# Patient Record
Sex: Male | Born: 1990 | Hispanic: Yes | Marital: Single | Smoking: Never smoker
Health system: Southern US, Community
[De-identification: ages and names within clinical notes are randomized; demographics above are authoritative.]

## PROBLEM LIST (undated history)

## (undated) ENCOUNTER — Emergency Department: Admission: EM | Discharge status: Against Medical Advice | Payer: Self-pay

## (undated) DIAGNOSIS — F329 Major depressive disorder, single episode, unspecified: Secondary | ICD-10-CM

## (undated) DIAGNOSIS — F32A Depression, unspecified: Secondary | ICD-10-CM

## (undated) HISTORY — PX: EYE MUSCLE SURGERY: SHX370

---
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
## 2013-06-26 ENCOUNTER — Encounter (HOSPITAL_COMMUNITY): Payer: Medicaid - Out of State | Admitting: Anesthesiology

## 2013-06-26 ENCOUNTER — Inpatient Hospital Stay (HOSPITAL_COMMUNITY): Payer: Medicaid - Out of State

## 2013-06-26 ENCOUNTER — Encounter (HOSPITAL_COMMUNITY): Admission: EM | Disposition: A | Discharge status: Home / Self Care | Payer: Self-pay | Source: Home / Self Care

## 2013-06-26 ENCOUNTER — Inpatient Hospital Stay (HOSPITAL_COMMUNITY)
Admission: EM | Admit: 2013-06-26 | Discharge: 2013-07-01 | DRG: 481 | Disposition: A | Discharge status: Home / Self Care | Payer: Medicaid - Out of State | Attending: General Surgery | Admitting: General Surgery

## 2013-06-26 ENCOUNTER — Emergency Department (HOSPITAL_COMMUNITY): Payer: Medicaid - Out of State

## 2013-06-26 ENCOUNTER — Emergency Department (HOSPITAL_COMMUNITY): Payer: Medicaid - Out of State | Admitting: Anesthesiology

## 2013-06-26 ENCOUNTER — Encounter (HOSPITAL_COMMUNITY): Payer: Self-pay | Admitting: Emergency Medicine

## 2013-06-26 DIAGNOSIS — S7290XA Unspecified fracture of unspecified femur, initial encounter for closed fracture: Principal | ICD-10-CM | POA: Diagnosis present

## 2013-06-26 DIAGNOSIS — F411 Generalized anxiety disorder: Secondary | ICD-10-CM | POA: Diagnosis not present

## 2013-06-26 DIAGNOSIS — S71132A Puncture wound without foreign body, left thigh, initial encounter: Secondary | ICD-10-CM

## 2013-06-26 DIAGNOSIS — S71109A Unspecified open wound, unspecified thigh, initial encounter: Secondary | ICD-10-CM | POA: Diagnosis present

## 2013-06-26 DIAGNOSIS — W3400XA Accidental discharge from unspecified firearms or gun, initial encounter: Secondary | ICD-10-CM | POA: Diagnosis not present

## 2013-06-26 DIAGNOSIS — D62 Acute posthemorrhagic anemia: Secondary | ICD-10-CM

## 2013-06-26 DIAGNOSIS — S7292XA Unspecified fracture of left femur, initial encounter for closed fracture: Secondary | ICD-10-CM

## 2013-06-26 DIAGNOSIS — S71009A Unspecified open wound, unspecified hip, initial encounter: Secondary | ICD-10-CM | POA: Diagnosis present

## 2013-06-26 HISTORY — DX: Depression, unspecified: F32.A

## 2013-06-26 HISTORY — PX: I&D EXTREMITY: SHX5045

## 2013-06-26 HISTORY — DX: Major depressive disorder, single episode, unspecified: F32.9

## 2013-06-26 HISTORY — PX: FEMUR IM NAIL: SHX1597

## 2013-06-26 LAB — BASIC METABOLIC PANEL
BUN: 8 mg/dL (ref 6–23)
CO2: 24 mEq/L (ref 19–32)
CREATININE: 0.74 mg/dL (ref 0.50–1.35)
Calcium: 7.9 mg/dL — ABNORMAL LOW (ref 8.4–10.5)
Chloride: 107 mEq/L (ref 96–112)
GFR calc Af Amer: >90 mL/min (ref >90)
GLUCOSE: 149 mg/dL — AB (ref 70–99)
POTASSIUM: 4.1 meq/L (ref 3.7–5.3)
Sodium: 142 mEq/L (ref 137–147)

## 2013-06-26 LAB — CBC
HEMATOCRIT: 37.6 % — AB (ref 39.0–52.0)
HEMATOCRIT: 32.6 % — AB (ref 39.0–52.0)
Hemoglobin: 12.6 g/dL — ABNORMAL LOW (ref 13.0–17.0)
Hemoglobin: 11.1 g/dL — ABNORMAL LOW (ref 13.0–17.0)
MCH: 30.3 pg (ref 26.0–34.0)
MCH: 30.7 pg (ref 26.0–34.0)
MCHC: 33.5 g/dL (ref 30.0–36.0)
MCHC: 34 g/dL (ref 30.0–36.0)
MCV: 90.4 fL (ref 78.0–100.0)
MCV: 90.1 fL (ref 78.0–100.0)
Platelets: 158 10*3/uL (ref 150–400)
Platelets: 149 10*3/uL — ABNORMAL LOW (ref 150–400)
RBC: 4.16 MIL/uL — ABNORMAL LOW (ref 4.22–5.81)
RBC: 3.62 MIL/uL — ABNORMAL LOW (ref 4.22–5.81)
RDW: 12.8 % (ref 11.5–15.5)
RDW: 12.6 % (ref 11.5–15.5)
WBC: 17.4 10*3/uL — ABNORMAL HIGH (ref 4.0–10.5)
WBC: 11.5 10*3/uL — ABNORMAL HIGH (ref 4.0–10.5)

## 2013-06-26 LAB — POCT I-STAT 4, (NA,K, GLUC, HGB,HCT)
Glucose, Bld: 108 mg/dL — ABNORMAL HIGH (ref 70–99)
Glucose, Bld: 112 mg/dL — ABNORMAL HIGH (ref 70–99)
HCT: 42 % (ref 39.0–52.0)
HCT: 36 % — ABNORMAL LOW (ref 39.0–52.0)
HEMOGLOBIN: 14.3 g/dL (ref 13.0–17.0)
HEMOGLOBIN: 12.2 g/dL — AB (ref 13.0–17.0)
POTASSIUM: 3.7 meq/L (ref 3.7–5.3)
Potassium: 3.8 mEq/L (ref 3.7–5.3)
SODIUM: 142 meq/L (ref 137–147)
Sodium: 141 mEq/L (ref 137–147)

## 2013-06-26 LAB — CBC WITH DIFFERENTIAL/PLATELET
BASOS PCT: 0 % (ref 0–1)
Basophils Absolute: 0 10*3/uL (ref 0.0–0.1)
EOS ABS: 0.2 10*3/uL (ref 0.0–0.7)
EOS PCT: 1 % (ref 0–5)
HCT: 46.4 % (ref 39.0–52.0)
Hemoglobin: 15.8 g/dL (ref 13.0–17.0)
LYMPHS ABS: 5.8 10*3/uL — AB (ref 0.7–4.0)
Lymphocytes Relative: 48 % — ABNORMAL HIGH (ref 12–46)
MCH: 30.8 pg (ref 26.0–34.0)
MCHC: 34.1 g/dL (ref 30.0–36.0)
MCV: 90.4 fL (ref 78.0–100.0)
Monocytes Absolute: 0.9 10*3/uL (ref 0.1–1.0)
Monocytes Relative: 7 % (ref 3–12)
NEUTROS PCT: 44 % (ref 43–77)
Neutro Abs: 5.4 10*3/uL (ref 1.7–7.7)
Platelets: 183 10*3/uL (ref 150–400)
RBC: 5.13 MIL/uL (ref 4.22–5.81)
RDW: 12.6 % (ref 11.5–15.5)
WBC: 12.2 10*3/uL — ABNORMAL HIGH (ref 4.0–10.5)

## 2013-06-26 LAB — COMPREHENSIVE METABOLIC PANEL
ALBUMIN: 4.2 g/dL (ref 3.5–5.2)
ALT: 10 U/L (ref 0–53)
AST: 18 U/L (ref 0–37)
Alkaline Phosphatase: 56 U/L (ref 39–117)
BUN: 9 mg/dL (ref 6–23)
CALCIUM: 8.8 mg/dL (ref 8.4–10.5)
CO2: 21 mEq/L (ref 19–32)
Chloride: 101 mEq/L (ref 96–112)
Creatinine, Ser: 0.92 mg/dL (ref 0.50–1.35)
GFR calc Af Amer: >90 mL/min (ref >90)
GFR calc non Af Amer: >90 mL/min (ref >90)
GLUCOSE: 121 mg/dL — AB (ref 70–99)
Potassium: 3.4 mEq/L — ABNORMAL LOW (ref 3.7–5.3)
SODIUM: 141 meq/L (ref 137–147)
TOTAL PROTEIN: 6.9 g/dL (ref 6.0–8.3)
Total Bilirubin: 0.4 mg/dL (ref 0.3–1.2)

## 2013-06-26 LAB — CREATININE, SERUM: CREATININE: 0.74 mg/dL (ref 0.50–1.35)

## 2013-06-26 LAB — PREPARE FRESH FROZEN PLASMA
UNIT DIVISION: 0
Unit division: 0

## 2013-06-26 LAB — ABO/RH: ABO/RH(D): O POS

## 2013-06-26 SURGERY — INSERTION, INTRAMEDULLARY ROD, FEMUR
Anesthesia: General | Site: Leg Upper | Laterality: Left

## 2013-06-26 MED ORDER — METOCLOPRAMIDE HCL 10 MG PO TABS
5.0000 mg | ORAL_TABLET | Freq: Three times a day (TID) | ORAL | Status: DC | PRN
  Administered 2013-06-26: 10 mg via ORAL
  Filled 2013-06-26: qty 1

## 2013-06-26 MED ORDER — HYDROMORPHONE HCL PF 1 MG/ML IJ SOLN
INTRAMUSCULAR | Status: AC
  Filled 2013-06-26: qty 2

## 2013-06-26 MED ORDER — ONDANSETRON HCL 4 MG/2ML IJ SOLN: 4.0000 mg | Freq: Four times a day (QID) | INTRAMUSCULAR | Status: DC | PRN

## 2013-06-26 MED ORDER — LACTATED RINGERS IV SOLN
INTRAVENOUS | Status: DC | PRN
  Administered 2013-06-26 (×2): via INTRAVENOUS

## 2013-06-26 MED ORDER — HYDROMORPHONE HCL PF 1 MG/ML IJ SOLN
1.0000 mg | INTRAMUSCULAR | Status: DC | PRN
  Administered 2013-06-26: 1 mg via INTRAVENOUS
  Filled 2013-06-26: qty 1

## 2013-06-26 MED ORDER — ONDANSETRON HCL 4 MG/2ML IJ SOLN
4.0000 mg | Freq: Four times a day (QID) | INTRAMUSCULAR | Status: DC | PRN
  Administered 2013-06-26: 4 mg via INTRAVENOUS
  Filled 2013-06-26: qty 2

## 2013-06-26 MED ORDER — ONDANSETRON HCL 4 MG PO TABS: 4.0000 mg | ORAL_TABLET | Freq: Four times a day (QID) | ORAL | Status: DC | PRN

## 2013-06-26 MED ORDER — SUCCINYLCHOLINE CHLORIDE 20 MG/ML IJ SOLN
INTRAMUSCULAR | Status: DC | PRN
  Administered 2013-06-26: 100 mg via INTRAVENOUS

## 2013-06-26 MED ORDER — LIDOCAINE HCL (CARDIAC) 20 MG/ML IV SOLN
INTRAVENOUS | Status: AC
  Filled 2013-06-26: qty 5

## 2013-06-26 MED ORDER — HYDROMORPHONE HCL PF 1 MG/ML IJ SOLN
0.2500 mg | INTRAMUSCULAR | Status: DC | PRN
  Administered 2013-06-26 (×3): 0.5 mg via INTRAVENOUS

## 2013-06-26 MED ORDER — MENTHOL 3 MG MT LOZG: 1.0000 | LOZENGE | OROMUCOSAL | Status: DC | PRN

## 2013-06-26 MED ORDER — ENOXAPARIN SODIUM 40 MG/0.4ML ~~LOC~~ SOLN
40.0000 mg | SUBCUTANEOUS | Status: DC
  Administered 2013-06-27 – 2013-06-28 (×2): 40 mg via SUBCUTANEOUS
  Filled 2013-06-26 (×3): qty 0.4

## 2013-06-26 MED ORDER — LIDOCAINE HCL (CARDIAC) 20 MG/ML IV SOLN
INTRAVENOUS | Status: DC | PRN
  Administered 2013-06-26: 50 mg via INTRAVENOUS

## 2013-06-26 MED ORDER — FENTANYL CITRATE 0.05 MG/ML IJ SOLN
INTRAMUSCULAR | Status: DC | PRN
  Administered 2013-06-26: 50 ug via INTRAVENOUS
  Administered 2013-06-26: 25 ug via INTRAVENOUS
  Administered 2013-06-26: 50 ug via INTRAVENOUS
  Administered 2013-06-26: 25 ug via INTRAVENOUS

## 2013-06-26 MED ORDER — METHOCARBAMOL 1000 MG/10ML IJ SOLN: 500.0000 mg | Freq: Four times a day (QID) | INTRAVENOUS | Status: DC | PRN

## 2013-06-26 MED ORDER — SODIUM CHLORIDE 0.9 % IV BOLUS (SEPSIS)
1000.0000 mL | Freq: Once | INTRAVENOUS | Status: AC
  Administered 2013-06-26: 1000 mL via INTRAVENOUS

## 2013-06-26 MED ORDER — CEFAZOLIN SODIUM-DEXTROSE 2-3 GM-% IV SOLR
INTRAVENOUS | Status: AC
  Filled 2013-06-26: qty 50

## 2013-06-26 MED ORDER — OXYCODONE HCL 5 MG PO TABS
ORAL_TABLET | ORAL | Status: AC
  Filled 2013-06-26: qty 1

## 2013-06-26 MED ORDER — PHENYLEPHRINE HCL 10 MG/ML IJ SOLN
INTRAMUSCULAR | Status: DC | PRN
  Administered 2013-06-26 (×2): 80 ug via INTRAVENOUS
  Administered 2013-06-26 (×3): 40 ug via INTRAVENOUS

## 2013-06-26 MED ORDER — FENTANYL CITRATE 0.05 MG/ML IJ SOLN
INTRAMUSCULAR | Status: AC
  Filled 2013-06-26: qty 5

## 2013-06-26 MED ORDER — PHENYLEPHRINE 40 MCG/ML (10ML) SYRINGE FOR IV PUSH (FOR BLOOD PRESSURE SUPPORT)
PREFILLED_SYRINGE | INTRAVENOUS | Status: AC
  Filled 2013-06-26: qty 10

## 2013-06-26 MED ORDER — MIDAZOLAM HCL 2 MG/2ML IJ SOLN
INTRAMUSCULAR | Status: AC
  Filled 2013-06-26: qty 2

## 2013-06-26 MED ORDER — HYDROMORPHONE HCL PF 1 MG/ML IJ SOLN: 1.0000 mg | INTRAMUSCULAR | Status: DC | PRN

## 2013-06-26 MED ORDER — KCL IN DEXTROSE-NACL 20-5-0.45 MEQ/L-%-% IV SOLN
INTRAVENOUS | Status: AC
  Administered 2013-06-26: 1000 mL
  Filled 2013-06-26: qty 1000

## 2013-06-26 MED ORDER — DEXTROSE-NACL 5-0.9 % IV SOLN: INTRAVENOUS | Status: DC

## 2013-06-26 MED ORDER — OXYCODONE HCL 5 MG PO TABS
10.0000 mg | ORAL_TABLET | ORAL | Status: DC | PRN
  Administered 2013-06-26 (×4): 10 mg via ORAL
  Administered 2013-06-27 – 2013-07-01 (×11): 20 mg via ORAL
  Administered 2013-07-01: 10 mg via ORAL
  Administered 2013-07-01: 20 mg via ORAL
  Filled 2013-06-26 (×2): qty 4
  Filled 2013-06-26: qty 2
  Filled 2013-06-26 (×7): qty 4
  Filled 2013-06-26: qty 2
  Filled 2013-06-26: qty 4
  Filled 2013-06-26 (×2): qty 2
  Filled 2013-06-26 (×2): qty 4
  Filled 2013-06-26: qty 2

## 2013-06-26 MED ORDER — MIDAZOLAM HCL 5 MG/5ML IJ SOLN
INTRAMUSCULAR | Status: DC | PRN
  Administered 2013-06-26: 2 mg via INTRAVENOUS

## 2013-06-26 MED ORDER — HYDROMORPHONE HCL PF 1 MG/ML IJ SOLN
2.0000 mg | Freq: Once | INTRAMUSCULAR | Status: AC
  Administered 2013-06-26: 2 mg via INTRAVENOUS

## 2013-06-26 MED ORDER — OXYCODONE HCL 5 MG PO TABS
5.0000 mg | ORAL_TABLET | ORAL | Status: DC | PRN
  Administered 2013-06-26: 10 mg via ORAL

## 2013-06-26 MED ORDER — HYDROMORPHONE HCL PF 1 MG/ML IJ SOLN
0.5000 mg | INTRAMUSCULAR | Status: DC | PRN
  Administered 2013-06-26: 0.5 mg via INTRAVENOUS
  Filled 2013-06-26 (×2): qty 1

## 2013-06-26 MED ORDER — ACETAMINOPHEN 650 MG RE SUPP: 650.0000 mg | Freq: Four times a day (QID) | RECTAL | Status: DC | PRN

## 2013-06-26 MED ORDER — LACTATED RINGERS IV SOLN
INTRAVENOUS | Status: DC | PRN
  Administered 2013-06-26: 01:00:00 via INTRAVENOUS

## 2013-06-26 MED ORDER — ACETAMINOPHEN 325 MG PO TABS
650.0000 mg | ORAL_TABLET | Freq: Four times a day (QID) | ORAL | Status: DC | PRN
  Administered 2013-06-28: 650 mg via ORAL
  Filled 2013-06-26: qty 2

## 2013-06-26 MED ORDER — KCL IN DEXTROSE-NACL 20-5-0.45 MEQ/L-%-% IV SOLN
INTRAVENOUS | Status: DC
  Filled 2013-06-26 (×2): qty 1000

## 2013-06-26 MED ORDER — SODIUM CHLORIDE 0.9 % IR SOLN
Status: DC | PRN
  Administered 2013-06-26: 3000 mL

## 2013-06-26 MED ORDER — ONDANSETRON HCL 4 MG/2ML IJ SOLN
INTRAMUSCULAR | Status: AC
  Filled 2013-06-26: qty 2

## 2013-06-26 MED ORDER — HYDROCODONE-ACETAMINOPHEN 5-325 MG PO TABS: 2.0000 | ORAL_TABLET | ORAL | Status: DC | PRN

## 2013-06-26 MED ORDER — METOCLOPRAMIDE HCL 5 MG/ML IJ SOLN
5.0000 mg | Freq: Three times a day (TID) | INTRAMUSCULAR | Status: DC | PRN
  Administered 2013-06-26: 10 mg via INTRAVENOUS
  Filled 2013-06-26 (×2): qty 2

## 2013-06-26 MED ORDER — HYDROCODONE-ACETAMINOPHEN 5-325 MG PO TABS: 1.0000 | ORAL_TABLET | Freq: Four times a day (QID) | ORAL | Status: DC | PRN

## 2013-06-26 MED ORDER — CEFAZOLIN SODIUM-DEXTROSE 2-3 GM-% IV SOLR
2.0000 g | Freq: Once | INTRAVENOUS | Status: AC
  Administered 2013-06-26: 2 g via INTRAVENOUS

## 2013-06-26 MED ORDER — HYDROMORPHONE HCL PF 1 MG/ML IJ SOLN
1.0000 mg | INTRAMUSCULAR | Status: DC | PRN
  Administered 2013-06-26 – 2013-06-28 (×5): 1 mg via INTRAVENOUS
  Filled 2013-06-26 (×4): qty 1

## 2013-06-26 MED ORDER — METHOCARBAMOL 500 MG PO TABS
500.0000 mg | ORAL_TABLET | Freq: Four times a day (QID) | ORAL | Status: DC | PRN
  Administered 2013-06-26 – 2013-07-01 (×9): 500 mg via ORAL
  Filled 2013-06-26 (×9): qty 1

## 2013-06-26 MED ORDER — SUCCINYLCHOLINE CHLORIDE 20 MG/ML IJ SOLN
INTRAMUSCULAR | Status: AC
  Filled 2013-06-26: qty 1

## 2013-06-26 MED ORDER — OXYCODONE HCL 5 MG PO TABS
ORAL_TABLET | ORAL | Status: AC
  Administered 2013-06-27: 20 mg via ORAL
  Filled 2013-06-26: qty 2

## 2013-06-26 MED ORDER — ONDANSETRON HCL 4 MG/2ML IJ SOLN
INTRAMUSCULAR | Status: DC | PRN
  Administered 2013-06-26: 4 mg via INTRAVENOUS

## 2013-06-26 MED ORDER — ALBUMIN HUMAN 5 % IV SOLN
INTRAVENOUS | Status: DC | PRN
  Administered 2013-06-26 (×2): via INTRAVENOUS

## 2013-06-26 MED ORDER — HYDROMORPHONE HCL PF 1 MG/ML IJ SOLN
INTRAMUSCULAR | Status: AC
  Administered 2013-06-26: 1 mg via INTRAVENOUS
  Filled 2013-06-26: qty 1

## 2013-06-26 MED ORDER — PHENOL 1.4 % MT LIQD: 1.0000 | OROMUCOSAL | Status: DC | PRN

## 2013-06-26 MED ORDER — CEFAZOLIN SODIUM-DEXTROSE 2-3 GM-% IV SOLR
2.0000 g | Freq: Three times a day (TID) | INTRAVENOUS | Status: AC
  Administered 2013-06-26 – 2013-06-27 (×6): 2 g via INTRAVENOUS
  Filled 2013-06-26 (×6): qty 50

## 2013-06-26 MED ORDER — ARTIFICIAL TEARS OP OINT
TOPICAL_OINTMENT | OPHTHALMIC | Status: AC
  Filled 2013-06-26: qty 3.5

## 2013-06-26 MED ORDER — ONDANSETRON HCL 4 MG/2ML IJ SOLN
4.0000 mg | Freq: Once | INTRAMUSCULAR | Status: AC
  Administered 2013-06-26: 4 mg via INTRAVENOUS

## 2013-06-26 MED ORDER — HYDROMORPHONE HCL PF 1 MG/ML IJ SOLN
INTRAMUSCULAR | Status: AC
  Filled 2013-06-26: qty 1

## 2013-06-26 MED ORDER — PROPOFOL 10 MG/ML IV BOLUS
INTRAVENOUS | Status: DC | PRN
  Administered 2013-06-26: 150 mg via INTRAVENOUS

## 2013-06-26 MED ORDER — ARTIFICIAL TEARS OP OINT
TOPICAL_OINTMENT | OPHTHALMIC | Status: DC | PRN
  Administered 2013-06-26: 1 via OPHTHALMIC

## 2013-06-26 MED ORDER — METHOCARBAMOL 500 MG PO TABS
ORAL_TABLET | ORAL | Status: AC
  Administered 2013-06-27: 500 mg via ORAL
  Filled 2013-06-26: qty 1

## 2013-06-26 SURGICAL SUPPLY — 72 items
BANDAGE ELASTIC 4 VELCRO ST LF (GAUZE/BANDAGES/DRESSINGS) ×3 IMPLANT
BANDAGE GAUZE ELAST BULKY 4 IN (GAUZE/BANDAGES/DRESSINGS) IMPLANT
BIT DRILL 3.8X6 NS (BIT) ×6 IMPLANT
BIT DRILL 5.3 (BIT) ×3 IMPLANT
BNDG COHESIVE 4X5 TAN STRL (GAUZE/BANDAGES/DRESSINGS) ×3 IMPLANT
COVER PERINEAL POST (MISCELLANEOUS) ×3 IMPLANT
COVER SURGICAL LIGHT HANDLE (MISCELLANEOUS) ×3 IMPLANT
CUFF TOURNIQUET SINGLE 18IN (TOURNIQUET CUFF) ×3 IMPLANT
CUFF TOURNIQUET SINGLE 24IN (TOURNIQUET CUFF) IMPLANT
CUFF TOURNIQUET SINGLE 34IN LL (TOURNIQUET CUFF) IMPLANT
CUFF TOURNIQUET SINGLE 44IN (TOURNIQUET CUFF) IMPLANT
DRAPE INCISE IOBAN 66X45 STRL (DRAPES) ×3 IMPLANT
DRAPE PROXIMA HALF (DRAPES) ×3 IMPLANT
DRAPE STERI IOBAN 125X83 (DRAPES) IMPLANT
DRSG ADAPTIC 3X8 NADH LF (GAUZE/BANDAGES/DRESSINGS) IMPLANT
DRSG MEPILEX BORDER 4X4 (GAUZE/BANDAGES/DRESSINGS) ×3 IMPLANT
DRSG MEPILEX BORDER 4X8 (GAUZE/BANDAGES/DRESSINGS) ×3 IMPLANT
DRSG PAD ABDOMINAL 8X10 ST (GAUZE/BANDAGES/DRESSINGS) IMPLANT
DURAPREP 26ML APPLICATOR (WOUND CARE) ×6 IMPLANT
ELECT REM PT RETURN 9FT ADLT (ELECTROSURGICAL) ×3
ELECTRODE REM PT RTRN 9FT ADLT (ELECTROSURGICAL) ×1 IMPLANT
EVACUATOR 1/8 PVC DRAIN (DRAIN) ×3 IMPLANT
FACESHIELD WRAPAROUND (MASK) IMPLANT
GAUZE SPONGE 4X4 16PLY XRAY LF (GAUZE/BANDAGES/DRESSINGS) ×3 IMPLANT
GLOVE BIO SURGEON STRL SZ 6.5 (GLOVE) ×2 IMPLANT
GLOVE BIO SURGEON STRL SZ8 (GLOVE) ×6 IMPLANT
GLOVE BIO SURGEONS STRL SZ 6.5 (GLOVE) ×1
GLOVE BIOGEL PI IND STRL 7.5 (GLOVE) ×1 IMPLANT
GLOVE BIOGEL PI INDICATOR 7.5 (GLOVE) ×2
GLOVE SURG SS PI 7.5 STRL IVOR (GLOVE) ×3 IMPLANT
GLOVE SURG SS PI 8.0 STRL IVOR (GLOVE) ×3 IMPLANT
GOWN EXTRA PROTECTION XL (GOWNS) IMPLANT
GOWN STRL REUS W/ TWL LRG LVL3 (GOWN DISPOSABLE) ×1 IMPLANT
GOWN STRL REUS W/TWL LRG LVL3 (GOWN DISPOSABLE) ×2
GUIDEPIN 3.2X17.5 THRD DISP (PIN) ×9 IMPLANT
GUIDEWIRE BALL NOSE 80CM (WIRE) ×3 IMPLANT
HANDPIECE INTERPULSE COAX TIP (DISPOSABLE) ×2
KIT BASIN OR (CUSTOM PROCEDURE TRAY) ×3 IMPLANT
KIT ROOM TURNOVER OR (KITS) ×3 IMPLANT
LINER BOOT UNIVERSAL DISP (MISCELLANEOUS) ×3 IMPLANT
MANIFOLD NEPTUNE II (INSTRUMENTS) ×3 IMPLANT
NAIL TROCH ENTRY 11X34 (Nail) ×3 IMPLANT
NS IRRIG 1000ML POUR BTL (IV SOLUTION) ×3 IMPLANT
PACK GENERAL/GYN (CUSTOM PROCEDURE TRAY) ×3 IMPLANT
PACK ORTHO EXTREMITY (CUSTOM PROCEDURE TRAY) ×3 IMPLANT
PAD ARMBOARD 7.5X6 YLW CONV (MISCELLANEOUS) ×6 IMPLANT
SCREW ACE CAP BN (Screw) ×2 IMPLANT
SCREW ACE CORTICAL (Screw) ×4 IMPLANT
SCREW BN FT 80X6.5XST DRV (Screw) ×2 IMPLANT
SCREW BN OBLQ FT 54X4.5XST 2 (Screw) ×1 IMPLANT
SCREW CORT BONE 4.5X50 1402250 (Screw) ×3 IMPLANT
SCREWDRIVER HEX TIP 3.5MM (MISCELLANEOUS) ×3 IMPLANT
SET HNDPC FAN SPRY TIP SCT (DISPOSABLE) ×1 IMPLANT
SPONGE GAUZE 4X4 12PLY (GAUZE/BANDAGES/DRESSINGS) ×3 IMPLANT
SPONGE LAP 18X18 X RAY DECT (DISPOSABLE) ×3 IMPLANT
SPONGE LAP 4X18 X RAY DECT (DISPOSABLE) IMPLANT
STAPLER VISISTAT (STAPLE) ×3 IMPLANT
STAPLER VISISTAT 35W (STAPLE) ×3 IMPLANT
STOCKINETTE IMPERVIOUS 9X36 MD (GAUZE/BANDAGES/DRESSINGS) IMPLANT
SUT ETHILON 3 0 PS 1 (SUTURE) ×3 IMPLANT
SUT VIC AB 0 CTB1 27 (SUTURE) ×3 IMPLANT
SUT VIC AB 1 CTB1 27 (SUTURE) ×3 IMPLANT
SUT VIC AB 2-0 CTB1 (SUTURE) ×3 IMPLANT
TAPE STRIPS DRAPE STRL (GAUZE/BANDAGES/DRESSINGS) ×3 IMPLANT
TOWEL OR 17X24 6PK STRL BLUE (TOWEL DISPOSABLE) ×3 IMPLANT
TOWEL OR 17X26 10 PK STRL BLUE (TOWEL DISPOSABLE) ×3 IMPLANT
TUBE ANAEROBIC SPECIMEN COL (MISCELLANEOUS) IMPLANT
TUBE CONNECTING 12'X1/4 (SUCTIONS) ×1
TUBE CONNECTING 12X1/4 (SUCTIONS) ×2 IMPLANT
UNDERPAD 30X30 INCONTINENT (UNDERPADS AND DIAPERS) ×3 IMPLANT
WATER STERILE IRR 1000ML POUR (IV SOLUTION) ×9 IMPLANT
YANKAUER SUCT BULB TIP NO VENT (SUCTIONS) ×3 IMPLANT
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
## 2013-06-26 NOTE — ED Notes (Signed)
Patient in OR at this time, from CT.

## 2013-06-26 NOTE — Progress Notes (Signed)
Patient crying off and on since 1930, post discussion of how long until leg is healed, at that time patient became very emotional to learn average recovery time for a fractured bone is 6 weeks. Patient has continued to escalate in crying and requests for analgesic medications. 10 mg oxy given to suppliment receeding 10 mg dose, and 1 mg IV dilaudid given at present. Family distraught,Sister, earlier while I was on the phone with Dr Lindie SpruceWyatt requesting higher dose of iv analgesic. Delay explained to patient and father in room when narcotics taken in to patient.

## 2013-06-26 NOTE — ED Notes (Signed)
Patient to CT with RN and EMT.

## 2013-06-26 NOTE — Anesthesia Postprocedure Evaluation (Signed)
  Anesthesia Post-op Note  Patient: Douglas Morrison  Procedure(s) Performed: Procedure(s): INTRAMEDULLARY (IM) NAIL FEMORAL, (Left) IRRIGATION AND DEBRIDEMENT LEFT LEG (Left)  Patient Location: PACU  Anesthesia Type:General  Level of Consciousness: awake  Airway and Oxygen Therapy: Patient Spontanous Breathing  Post-op Pain: mild  Post-op Assessment: Post-op Vital signs reviewed  Post-op Vital Signs: Reviewed  Last Vitals:  Filed Vitals:   06/26/13 0415  BP:   Pulse: 119  Temp:   Resp: 25    Complications: No apparent anesthesia complications

## 2013-06-26 NOTE — Anesthesia Preprocedure Evaluation (Addendum)
Anesthesia Evaluation  Patient identified by MRN, date of birth, ID band Patient awake  General Assessment Comment:Emergency surgery. History obtained from patient and chart.  Reviewed: Allergy & Precautions, H&P , NPO status , Patient's Chart, lab work & pertinent test results  Airway Mallampati: II TM Distance: >3 FB Neck ROM: Full    Dental  (+) Dental Advisory Given,    Pulmonary  breath sounds clear to auscultation        Cardiovascular negative cardio ROS  Rhythm:Regular Rate:Normal     Neuro/Psych    GI/Hepatic negative GI ROS, Neg liver ROS,   Endo/Other    Renal/GU negative Renal ROS     Musculoskeletal   Abdominal   Peds  Hematology   Anesthesia Other Findings   Reproductive/Obstetrics                         Anesthesia Physical Anesthesia Plan  ASA: I and emergent  Anesthesia Plan: General   Post-op Pain Management:    Induction: Intravenous and Rapid sequence  Airway Management Planned: Oral ETT  Additional Equipment:   Intra-op Plan:   Post-operative Plan: Post-operative intubation/ventilation  Informed Consent: I have reviewed the patients History and Physical, chart, labs and discussed the procedure including the risks, benefits and alternatives for the proposed anesthesia with the patient or authorized representative who has indicated his/her understanding and acceptance.   Dental advisory given  Plan Discussed with: CRNA, Anesthesiologist and Surgeon  Anesthesia Plan Comments:        Anesthesia Quick Evaluation
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
## 2013-06-26 NOTE — Progress Notes (Signed)
OT Cancellation Note  Patient Details Name: Douglas Morrison XXXRamirezBarcenas MRN: [id number] DOB: 07/26/1990   Cancelled Treatment:    Reason Eval/Treat Not Completed: Pain limiting ability to participate. Pt stating pain 9/10. Nursing notified. OT to reattempt.  [name], Kathryn H 06/26/2013, 3:00 PM

## 2013-06-26 NOTE — Progress Notes (Signed)
Dr Lindie SpruceWyatt paged for increase in iv pain medications, d/t both po regiment and iv 0.5 mg Dilaudid not achieving pain control
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
## 2013-06-26 NOTE — Anesthesia Procedure Notes (Signed)
Procedure Name: Intubation Date/Time: 06/26/2013 1:20 AM Performed by: Luster LandsbergHASE, TONJA R Pre-anesthesia Checklist: Patient identified, Emergency Drugs available, Suction available and Patient being monitored Patient Re-evaluated:Patient Re-evaluated prior to inductionOxygen Delivery Method: Circle system utilized Preoxygenation: Pre-oxygenation with 100% oxygen Intubation Type: IV induction, Rapid sequence and Cricoid Pressure applied Laryngoscope Size: Mac and 3 Grade View: Grade I Tube type: Oral Tube size: 8.0 mm Airway Equipment and Method: Stylet Placement Confirmation: ETT inserted through vocal cords under direct vision,  positive ETCO2 and breath sounds checked- equal and bilateral Secured at: 22 cm Tube secured with: Tape Dental Injury: Teeth and Oropharynx as per pre-operative assessment

## 2013-06-26 NOTE — Progress Notes (Signed)
Patient looking pale, diaphoretic and mildly SOB.  Probably vagal after vomiting in bed and after getting into the chair.  Good strong pulses.  Rechecking vitals.  This patient has been seen and I agree with the findings and treatment plan.  Marta LamasJames O. Gae BonWyatt, III, MD, FACS [phone] (pager) [phone] (direct pager) Trauma Surgeon

## 2013-06-26 NOTE — Progress Notes (Signed)
Orthopedic Tech Progress Note Patient Details:  Douglas Morrison 1990-04-26 [id number]  Patient ID: Douglas Morrison, male   DOB: 1990-04-26, 23 y.o.   [name]   Shawnie PonsCammer, Jennifer Carol 06/26/2013, 4:25 PMTrapeze bar

## 2013-06-26 NOTE — Progress Notes (Signed)
Patient arrived to 5n21 from PACU. Alert and oriented X 4. Left leg pain at 10/10. Vital signs stable. Mepilex x2 to left leg surgery site with acordian drainage bag. Foley cath draining clear yellow urine.

## 2013-06-26 NOTE — Transfer of Care (Signed)
Immediate Anesthesia Transfer of Care Note  Patient: Douglas Morrison  Procedure(s) Performed: Procedure(s): INTRAMEDULLARY (IM) NAIL FEMORAL, (Left) IRRIGATION AND DEBRIDEMENT LEFT LEG (Left)  Patient Location: PACU  Anesthesia Type:General  Level of Consciousness: awake, alert  and oriented  Airway & Oxygen Therapy: Patient Spontanous Breathing and Patient connected to nasal cannula oxygen  Post-op Assessment: Report given to PACU RN, Post -op Vital signs reviewed and stable and Patient moving all extremities  Post vital signs: Reviewed and stable  Complications: No apparent anesthesia complications
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
## 2013-06-26 NOTE — Progress Notes (Signed)
UR completed.  Michelle Bryson, RN BSN [facility] CCM Trauma/Neuro ICU Case Manager [phone]  
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
## 2013-06-26 NOTE — Progress Notes (Signed)
Orthopedic Tech Progress Note Patient Details:  Douglas Morrison Aug 04, 1990 [id number]  Patient ID: Douglas Morrison, male   DOB: Aug 04, 1990, 23 y.o.   MRN: [id number] Trapeze bar patient helper  Nikki DomCrawford, Rembert 06/26/2013, 1:41 PM

## 2013-06-27 ENCOUNTER — Encounter (HOSPITAL_COMMUNITY): Payer: Self-pay | Admitting: Specialist

## 2013-06-27 LAB — CBC
HCT: 31.9 % — ABNORMAL LOW (ref 39.0–52.0)
Hemoglobin: 10.8 g/dL — ABNORMAL LOW (ref 13.0–17.0)
MCH: 31.5 pg (ref 26.0–34.0)
MCHC: 33.9 g/dL (ref 30.0–36.0)
MCV: 93 fL (ref 78.0–100.0)
PLATELETS: 134 10*3/uL — AB (ref 150–400)
RBC: 3.43 MIL/uL — ABNORMAL LOW (ref 4.22–5.81)
RDW: 12.8 % (ref 11.5–15.5)
WBC: 10.6 10*3/uL — AB (ref 4.0–10.5)

## 2013-06-27 MED ORDER — MORPHINE SULFATE ER 15 MG PO TBCR
15.0000 mg | EXTENDED_RELEASE_TABLET | Freq: Two times a day (BID) | ORAL | Status: DC
  Administered 2013-06-27 – 2013-07-01 (×9): 15 mg via ORAL
  Filled 2013-06-27 (×9): qty 1

## 2013-06-27 MED ORDER — LORAZEPAM 0.5 MG PO TABS
0.5000 mg | ORAL_TABLET | Freq: Four times a day (QID) | ORAL | Status: DC | PRN
  Administered 2013-06-27 – 2013-07-01 (×6): 1 mg via ORAL
  Filled 2013-06-27 (×6): qty 2
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
## 2013-06-27 NOTE — Progress Notes (Signed)
Pain med change has helped a lot this AM. Will see how getting up with therapies goes. Patient examined and I agree with the assessment and plan  Violeta GelinasBurke Thompson, MD, MPH, FACS Trauma: [phone] General Surgery: [phone]  06/27/2013 9:43 AM
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
## 2013-06-27 NOTE — Progress Notes (Signed)
Left lateral thigh surgical dressing reinforced with abd's and paper tape

## 2013-06-27 NOTE — Progress Notes (Signed)
Better pain control with increased oxy and dilaudid for breakthrough pain. Father stayed with patient this pm shift and patient improved mood and disposition after multiple visitors departed.
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
## 2013-06-28 LAB — CBC
HCT: 20.9 % — ABNORMAL LOW (ref 39.0–52.0)
HCT: 23 % — ABNORMAL LOW (ref 39.0–52.0)
HEMATOCRIT: 25.1 % — AB (ref 39.0–52.0)
HEMOGLOBIN: 8.9 g/dL — AB (ref 13.0–17.0)
HEMOGLOBIN: 7.5 g/dL — AB (ref 13.0–17.0)
Hemoglobin: 8.1 g/dL — ABNORMAL LOW (ref 13.0–17.0)
MCH: 31 pg (ref 26.0–34.0)
MCH: 31.8 pg (ref 26.0–34.0)
MCH: 31.9 pg (ref 26.0–34.0)
MCHC: 35.2 g/dL (ref 30.0–36.0)
MCHC: 35.5 g/dL (ref 30.0–36.0)
MCHC: 35.9 g/dL (ref 30.0–36.0)
MCV: 88.1 fL (ref 78.0–100.0)
MCV: 88.9 fL (ref 78.0–100.0)
MCV: 89.6 fL (ref 78.0–100.0)
PLATELETS: 152 10*3/uL (ref 150–400)
PLATELETS: 142 10*3/uL — AB (ref 150–400)
Platelets: 130 10*3/uL — ABNORMAL LOW (ref 150–400)
RBC: 2.61 MIL/uL — AB (ref 4.22–5.81)
RBC: 2.8 MIL/uL — AB (ref 4.22–5.81)
RBC: 2.35 MIL/uL — AB (ref 4.22–5.81)
RDW: 12.2 % (ref 11.5–15.5)
RDW: 12.3 % (ref 11.5–15.5)
RDW: 12.3 % (ref 11.5–15.5)
WBC: 10.9 10*3/uL — ABNORMAL HIGH (ref 4.0–10.5)
WBC: 11.4 10*3/uL — AB (ref 4.0–10.5)
WBC: 10.3 10*3/uL (ref 4.0–10.5)

## 2013-06-28 LAB — HEMOGLOBIN: HEMOGLOBIN: 7.8 g/dL — AB (ref 13.0–17.0)

## 2013-06-28 MED ORDER — ADULT MULTIVITAMIN W/MINERALS CH
1.0000 | ORAL_TABLET | Freq: Every day | ORAL | Status: DC
  Administered 2013-06-28 – 2013-07-01 (×4): 1 via ORAL
  Filled 2013-06-28 (×4): qty 1

## 2013-06-28 MED ORDER — FERROUS SULFATE 325 (65 FE) MG PO TABS
325.0000 mg | ORAL_TABLET | Freq: Two times a day (BID) | ORAL | Status: DC
  Administered 2013-06-28 – 2013-07-01 (×6): 325 mg via ORAL
  Filled 2013-06-28 (×8): qty 1

## 2013-06-28 MED ORDER — DOCUSATE SODIUM 100 MG PO CAPS
100.0000 mg | ORAL_CAPSULE | Freq: Two times a day (BID) | ORAL | Status: DC
  Administered 2013-06-28 – 2013-07-01 (×7): 100 mg via ORAL
  Filled 2013-06-28 (×7): qty 1

## 2013-06-28 MED ORDER — POLYETHYLENE GLYCOL 3350 17 G PO PACK
17.0000 g | PACK | Freq: Every day | ORAL | Status: DC
  Administered 2013-06-28 – 2013-07-01 (×4): 17 g via ORAL
  Filled 2013-06-28 (×4): qty 1
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
## 2013-06-28 NOTE — Progress Notes (Signed)
Orthopedic Tech Progress Note Patient Details:  Douglas Morrison May 28, 1990 [id number]  Ortho Devices Type of Ortho Device: Postop shoe/boot;Other (comment) Ortho Device/Splint Location: Left Prafo Ortho Device/Splint Interventions: Application   Douglas Morrison 06/28/2013, 3:39 PM

## 2013-06-28 NOTE — Progress Notes (Signed)
I have read and agree with this note.   Time in/out: 15:09-15:22 Total time:18 minutes (1SC)  Ignacia Palmaathy Shandale Malak, OTR/L [phone]

## 2013-06-28 NOTE — Progress Notes (Addendum)
Subjective: 2 Days Post-Op Procedure(s) (LRB): INTRAMEDULLARY (IM) NAIL FEMORAL, (Left) IRRIGATION AND DEBRIDEMENT LEFT LEG (Left) Patient reports pain as moderate.  Anxious about getting up with PT. Pain about the same as yesterday. Dressings did need to be changed again yesterday. No longer c/o any N/V.  Objective: Vital signs in last 24 hours: Temp:  [97.8 F (36.6 C)-98.9 F (37.2 C)] 98.9 F (37.2 C) (06/26 0538) Pulse Rate:  [98-108] 108 (06/26 0538) Resp:  [16-20] 18 (06/26 0538) BP: (130-141)/(69-79) 130/72 mmHg (06/26 0538) SpO2:  [96 %-100 %] 98 % (06/26 0538)  Intake/Output from previous day: 06/25 0701 - 06/26 0700 In: 650 [P.O.:600; IV Piggyback:50] Out: 400 [Urine:400] Intake/Output this shift:     Recent Labs  06/26/13 0334 06/26/13 0613 06/26/13 2225 06/27/13 0513 06/28/13 0517  HGB 12.2* 12.6* 11.1* 10.8* 8.9*    Recent Labs  06/27/13 0513 06/28/13 0517  WBC 10.6* 10.9*  RBC 3.43* 2.80*  HCT 31.9* 25.1*  PLT 134* 130*    Recent Labs  06/26/13 0035  06/26/13 0334 06/26/13 0613 06/26/13 0732  NA 141  < > 142 142  --   K 3.4*  < > 3.8 4.1  --   CL 101  --   --  107  --   CO2 21  --   --  24  --   BUN 9  --   --  8  --   CREATININE 0.92  --   --  0.74 0.74  GLUCOSE 121*  < > 112* 149*  --   CALCIUM 8.8  --   --  7.9*  --   < > = values in this interval not displayed. No results found for this basename: LABPT, INR,  in the last 72 hours  Neurologically intact ABD soft Neurovascular intact Sensation intact distally Intact pulses distally Dorsiflexion/Plantar flexion intact Incision: dressing C/D/I and scant drainage No cellulitis present Compartment soft no calf pain or sign of DVT  Assessment/Plan: 2 Days Post-Op Procedure(s) (LRB): INTRAMEDULLARY (IM) NAIL FEMORAL, (Left) IRRIGATION AND DEBRIDEMENT LEFT LEG (Left) Advance diet Up with therapy D/C IV fluids Remain NWB LLE Pain meds per trauma service Acute blood loss  anemia, 8.9 this AM, would recommend transfusion if he becomes symptomatic Follow up 10-14 days post-op for suture/staple removal and xrays Will discuss with Dr. Elissa LovettBeane   Douglas Morrison, Douglas BarkerJACLYN M. 06/28/2013, 7:14 AM  Dr. Shelle IronBeane saw pt this afternoon. Due to increase LLE swelling and drop in Hgb, compartment testing performed on the L thigh with no evidence of compartment syndrome. Continue ice and elevation. Advised compression stockings. Plan to continue to watch Hgb.

## 2013-06-28 NOTE — Progress Notes (Signed)
Orthopedic Tech Progress Note Patient Details:  Douglas Morrison May 26, 1990 [id number] Compartment syndrome kit delivered for testing. Returned to [employer]. Patient ID: Douglas Morrison, male   DOB: [birth year], 23 y.o.   MRN: [id number]   Douglas Morrison 06/28/2013, 5:38 PM

## 2013-06-28 NOTE — Progress Notes (Signed)
Not sure why the patient dropped his hemoglobin so much, but his left leg is more swollen and tender, especially around the knee.  No compartment syndrome.  Agree with checking repeat H/H ;ater today.  This patient has been seen and I agree with the findings and treatment plan.  Marta LamasJames O. Gae BonWyatt, III, MD, FACS [phone] (pager) [phone] (direct pager) Trauma Surgeon

## 2013-06-29 ENCOUNTER — Inpatient Hospital Stay (HOSPITAL_COMMUNITY): Payer: Medicaid - Out of State

## 2013-06-29 DIAGNOSIS — S7290XA Unspecified fracture of unspecified femur, initial encounter for closed fracture: Secondary | ICD-10-CM

## 2013-06-29 LAB — CBC WITH DIFFERENTIAL/PLATELET
Basophils Absolute: 0 10*3/uL (ref 0.0–0.1)
Basophils Relative: 0 % (ref 0–1)
EOS PCT: 1 % (ref 0–5)
Eosinophils Absolute: 0.1 10*3/uL (ref 0.0–0.7)
HEMATOCRIT: 20.2 % — AB (ref 39.0–52.0)
HEMOGLOBIN: 7.1 g/dL — AB (ref 13.0–17.0)
LYMPHS ABS: 2.1 10*3/uL (ref 0.7–4.0)
Lymphocytes Relative: 24 % (ref 12–46)
MCH: 31.6 pg (ref 26.0–34.0)
MCHC: 35.1 g/dL (ref 30.0–36.0)
MCV: 89.8 fL (ref 78.0–100.0)
MONOS PCT: 13 % — AB (ref 3–12)
Monocytes Absolute: 1.2 10*3/uL — ABNORMAL HIGH (ref 0.1–1.0)
NEUTROS ABS: 5.5 10*3/uL (ref 1.7–7.7)
Neutrophils Relative %: 62 % (ref 43–77)
Platelets: ADEQUATE 10*3/uL (ref 150–400)
RBC: 2.25 MIL/uL — AB (ref 4.22–5.81)
RDW: 12.6 % (ref 11.5–15.5)
WBC: 8.9 10*3/uL (ref 4.0–10.5)

## 2013-06-29 LAB — PROTIME-INR
INR: 1.06 (ref 0.00–1.49)
Prothrombin Time: 13.8 seconds (ref 11.6–15.2)

## 2013-06-29 LAB — HEMOGLOBIN: Hemoglobin: 6.8 g/dL — CL (ref 13.0–17.0)

## 2013-06-29 LAB — PREPARE RBC (CROSSMATCH)

## 2013-06-29 MED ORDER — IOHEXOL 300 MG/ML  SOLN
100.0000 mL | Freq: Once | INTRAMUSCULAR | Status: AC | PRN
  Administered 2013-06-29: 100 mL via INTRAVENOUS

## 2013-06-29 NOTE — Progress Notes (Signed)
2049 end blood transfusion, no noted reaction
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
## 2013-06-29 NOTE — Progress Notes (Signed)
Douglas Morrison  MRN: [id number] DOB/Age: Jul 19, 1990 23 y.o. Physician: Jacquelyne BalintKevin Supple,MD Procedure: Procedure(s) (LRB): INTRAMEDULLARY (IM) NAIL FEMORAL, (Left) IRRIGATION AND DEBRIDEMENT LEFT LEG (Left)     Subjective: Feels much better than yesterday, pain improving  Vital Signs Temp:  [98 F (36.7 C)-98.6 F (37 C)] 98.4 F (36.9 C) (06/27 0544) Pulse Rate:  [98-121] 98 (06/27 0544) Resp:  [18] 18 (06/27 0544) BP: (111-128)/(56-90) 111/90 mmHg (06/27 0544) SpO2:  [97 %-98 %] 98 % (06/27 0544)  Lab Results  Recent Labs  06/28/13 2332 06/29/13 0541  WBC 10.3 8.9  HGB 7.5* 7.1*  HCT 20.9* 20.2*  PLT 142* PLATELET CLUMPS NOTED ON SMEAR, COUNT APPEARS ADEQUATE   BMET No results found for this basename: NA, K, CL, CO2, GLUCOSE, BUN, CREATININE, CALCIUM,  in the last 72 hours INR  Date Value Ref Range Status  06/29/2013 1.06  0.00 - 1.49 Final     Exam Left thigh still with significant swelling but compartments soft. Moves foot and ankle well Dressings with serous bloody drainage        Plan Cont PT/OT Mobilize and ROM to extremity Edema control.  SHUFORD,TRACY for Dr.Kevin Supple 06/29/2013, 10:30 AM

## 2013-06-29 NOTE — Progress Notes (Signed)
OT Cancellation Note  Patient Details Name: Douglas Morrison MRN: [id number] DOB: 05-08-1990   Cancelled Treatment:    Reason Eval/Treat Not Completed: Medical issues which prohibited therapy. Pt Hgb continues to trend down and pt is symptomatic. Reports he feels dizzy and nauseous. Pt told OT he would be receiving blood this afternoon. Communicated with RN and agreed to hold on therapy at this time. Will resume when medically ready. OT to continue to monitor and follow up with pt.  Rae LipsMiller, LeeAnn M [phone] 06/29/2013, 4:19 PM
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
## 2013-06-29 NOTE — Progress Notes (Signed)
PT Cancellation Note  Patient Details Name: Douglas Morrison MRN: [id number] DOB: 07/04/1990   Cancelled Treatment:    Reason Eval/Treat Not Completed: Patient not medically ready Hgb continues to trend downward and pt is symptomatic. Reports he feels very dizzy whenever he tries to get out of bed and it takes quite some time to resolve once supine again. Spoke with Nurse Noreene LarssonJill who has messaged on call physician. Will hold at this time.  Pt does report that he will have supervision for OOB mobility at home. Although not 24 hour care, he states family is in-and-out during the day and will not be left alone for extended periods of time.  [address]Logan Secor BraseltonBarbour, [place]PT [phone]  Berton MountBarbour, Douglas S 06/29/2013, 2:33 PM

## 2013-06-30 LAB — TYPE AND SCREEN
ABO/RH(D): O POS
ANTIBODY SCREEN: NEGATIVE
Unit division: 0
Unit division: 0
Unit division: 0
Unit division: 0

## 2013-06-30 LAB — CBC
HCT: 22.5 % — ABNORMAL LOW (ref 39.0–52.0)
HEMOGLOBIN: 7.6 g/dL — AB (ref 13.0–17.0)
MCH: 29.9 pg (ref 26.0–34.0)
MCHC: 33.8 g/dL (ref 30.0–36.0)
MCV: 88.6 fL (ref 78.0–100.0)
Platelets: 159 10*3/uL (ref 150–400)
RBC: 2.54 MIL/uL — AB (ref 4.22–5.81)
RDW: 13.8 % (ref 11.5–15.5)
WBC: 7.7 10*3/uL (ref 4.0–10.5)
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
## 2013-06-30 NOTE — Progress Notes (Signed)
OT Cancellation Note  Patient Details Name: Douglas Morrison XXXRamirezBarcenas MRN: [id number] DOB: 1990-05-10   Cancelled Treatment:    Reason Eval/Treat Not Completed: Patient declined, no reason specified . OT attempted to see pt x2 this date. First time, pt requested OT to come back when family is here around 3pm. OT returned at 3:15pm, family not present and pt in bed c/o fatigue. Pt requested to defer OT to tomorrow and asked when "financial lady" would be coming to talk to him. OT attempted to contact weekend CSW and was unable to get in touch.    Rae LipsMiller, LeeAnn M [phone] 06/30/2013, 3:28 PM
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
## 2013-06-30 NOTE — Progress Notes (Signed)
Monitor Hgb.  Wilmon ArmsMatthew K. Corliss Skainssuei, MD, [facility]  General/ Trauma Surgery  06/30/2013 12:58 PM
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
## 2013-07-01 LAB — CBC
HCT: 22.6 % — ABNORMAL LOW (ref 39.0–52.0)
HEMOGLOBIN: 7.7 g/dL — AB (ref 13.0–17.0)
MCH: 30.4 pg (ref 26.0–34.0)
MCHC: 34.1 g/dL (ref 30.0–36.0)
MCV: 89.3 fL (ref 78.0–100.0)
Platelets: 187 10*3/uL (ref 150–400)
RBC: 2.53 MIL/uL — ABNORMAL LOW (ref 4.22–5.81)
RDW: 14 % (ref 11.5–15.5)
WBC: 8 10*3/uL (ref 4.0–10.5)

## 2013-07-01 MED ORDER — METHOCARBAMOL 500 MG PO TABS: 500.0000 mg | ORAL_TABLET | Freq: Four times a day (QID) | ORAL | Status: DC | PRN

## 2013-07-01 MED ORDER — MORPHINE SULFATE ER 15 MG PO TBCR: 15.0000 mg | EXTENDED_RELEASE_TABLET | Freq: Two times a day (BID) | ORAL | Status: DC

## 2013-07-01 MED ORDER — OXYCODONE-ACETAMINOPHEN 10-325 MG PO TABS: 1.0000 | ORAL_TABLET | ORAL | Status: DC | PRN
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
## 2013-07-01 NOTE — Discharge Instructions (Signed)
Do not put weight on broken leg until cleared by orthopedic surgery.  No driving while taking oxycodone.
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
## 2013-07-01 NOTE — Progress Notes (Signed)
Subjective: 5 Days Post-Op Procedure(s) (LRB): INTRAMEDULLARY (IM) NAIL FEMORAL, (Left) IRRIGATION AND DEBRIDEMENT LEFT LEG (Left) Seen in AM rounds for Dr. Shelle IronBeane Patient reports pain as moderate.  Reports pain gradually improving. Did not participate in OT yesterday. Denies N/V, dizziness, lightheadedness.  Objective: Vital signs in last 24 hours: Temp:  [97.7 F (36.5 C)-98.4 F (36.9 C)] 98.1 F (36.7 C) (06/29 0602) Pulse Rate:  [65-94] 65 (06/29 0602) Resp:  [18] 18 (06/29 0602) BP: (119-128)/(60-71) 119/60 mmHg (06/29 0602) SpO2:  [99 %-100 %] 99 % (06/29 0602)  Intake/Output from previous day: 06/28 0701 - 06/29 0700 In: 360 [P.O.:360] Out: 700 [Urine:700] Intake/Output this shift:     Recent Labs  06/28/13 2332 06/29/13 0541 06/29/13 1327 06/30/13 0820 07/01/13 0036  HGB 7.5* 7.1* 6.8* 7.6* 7.7*    Recent Labs  06/30/13 0820 07/01/13 0036  WBC 7.7 8.0  RBC 2.54* 2.53*  HCT 22.5* 22.6*  PLT 159 187   No results found for this basename: NA, K, CL, CO2, BUN, CREATININE, GLUCOSE, CALCIUM,  in the last 72 hours  Recent Labs  06/29/13 0541  INR 1.06    Neurologically intact ABD soft Neurovascular intact Sensation intact distally Intact pulses distally Dorsiflexion/Plantar flexion intact Incision: dressing C/D/I and no drainage No cellulitis present Compartment soft no calf pain or sign of DVT LLE swelling improving  Assessment/Plan: 5 Days Post-Op Procedure(s) (LRB): INTRAMEDULLARY (IM) NAIL FEMORAL, (Left) IRRIGATION AND DEBRIDEMENT LEFT LEG (Left) Advance diet Up with therapy No evidence of compartment syndrome Hgb stablizing, 7.6 yesterday, 7.7 today, asymptomatic currently, will continue to watch Remain NWB LLE Will discuss with Dr. Elissa LovettBeane  BISSELL, Dayna BarkerJACLYN M. 07/01/2013, 7:40 AM

## 2013-07-01 NOTE — Progress Notes (Signed)
I have read and agree with this note.   Time in/out: 15:26-16:17 Total time: 51 minutes (2SC, 1TE)  Ignacia Palmaathy Leonard, OTR/L [phone]
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
## 2013-07-01 NOTE — Progress Notes (Signed)
Patient should be able to go home later today.  This patient has been seen and I agree with the findings and treatment plan.  Marta LamasJames O. Gae BonWyatt, III, MD, FACS [phone] (pager) [phone] (direct pager) Trauma Surgeon

## 2013-08-28 ENCOUNTER — Ambulatory Visit: Payer: Medicaid - Out of State | Attending: Specialist

## 2013-08-28 DIAGNOSIS — M25669 Stiffness of unspecified knee, not elsewhere classified: Secondary | ICD-10-CM | POA: Insufficient documentation

## 2013-08-28 DIAGNOSIS — M6281 Muscle weakness (generalized): Secondary | ICD-10-CM | POA: Insufficient documentation

## 2013-08-28 DIAGNOSIS — R262 Difficulty in walking, not elsewhere classified: Secondary | ICD-10-CM | POA: Insufficient documentation

## 2013-08-28 DIAGNOSIS — IMO0001 Reserved for inherently not codable concepts without codable children: Secondary | ICD-10-CM | POA: Insufficient documentation

## 2013-09-11 ENCOUNTER — Encounter: Payer: Medicaid - Out of State | Admitting: Physical Therapy

## 2013-09-18 ENCOUNTER — Encounter: Payer: Medicaid - Out of State | Admitting: Physical Therapy

## 2013-12-16 ENCOUNTER — Other Ambulatory Visit: Payer: Self-pay | Admitting: Specialist

## 2013-12-16 DIAGNOSIS — M96662 Fracture of femur following insertion of orthopedic implant, joint prosthesis, or bone plate, left leg: Secondary | ICD-10-CM

## 2013-12-20 ENCOUNTER — Ambulatory Visit
Admission: RE | Admit: 2013-12-20 | Discharge: 2013-12-20 | Disposition: A | Discharge status: Home / Self Care | Payer: 59 | Source: Ambulatory Visit | Attending: Specialist | Admitting: Specialist

## 2013-12-20 DIAGNOSIS — M96662 Fracture of femur following insertion of orthopedic implant, joint prosthesis, or bone plate, left leg: Secondary | ICD-10-CM

## 2014-01-01 ENCOUNTER — Ambulatory Visit: Payer: Self-pay | Admitting: Orthopedic Surgery

## 2014-01-17 ENCOUNTER — Ambulatory Visit (HOSPITAL_BASED_OUTPATIENT_CLINIC_OR_DEPARTMENT_OTHER): Admission: RE | Admit: 2014-01-17 | Payer: 59 | Source: Ambulatory Visit | Admitting: Specialist

## 2014-01-17 ENCOUNTER — Encounter (HOSPITAL_BASED_OUTPATIENT_CLINIC_OR_DEPARTMENT_OTHER): Admission: RE | Payer: Self-pay | Source: Ambulatory Visit

## 2014-01-17 SURGERY — REMOVAL FOREIGN BODY EXTREMITY
Anesthesia: General | Laterality: Left

## 2018-03-02 ENCOUNTER — Emergency Department (HOSPITAL_COMMUNITY): Payer: Medicaid - Out of State

## 2018-03-02 ENCOUNTER — Emergency Department (HOSPITAL_COMMUNITY)
Admission: EM | Admit: 2018-03-02 | Discharge: 2018-03-02 | Disposition: A | Discharge status: Other Acute Inpatient Hospital | Payer: Medicaid - Out of State | Attending: Emergency Medicine | Admitting: Emergency Medicine

## 2018-03-02 ENCOUNTER — Encounter (HOSPITAL_COMMUNITY): Payer: Self-pay

## 2018-03-02 ENCOUNTER — Inpatient Hospital Stay (HOSPITAL_COMMUNITY)
Admission: AD | Admit: 2018-03-02 | Discharge: 2018-03-03 | DRG: 880 | Disposition: A | Discharge status: Home / Self Care | Payer: Federal, State, Local not specified - Other | Source: Intra-hospital | Attending: Psychiatry | Admitting: Psychiatry

## 2018-03-02 ENCOUNTER — Other Ambulatory Visit: Payer: Self-pay

## 2018-03-02 DIAGNOSIS — F430 Acute stress reaction: Secondary | ICD-10-CM | POA: Diagnosis present

## 2018-03-02 DIAGNOSIS — F419 Anxiety disorder, unspecified: Secondary | ICD-10-CM | POA: Diagnosis present

## 2018-03-02 DIAGNOSIS — F332 Major depressive disorder, recurrent severe without psychotic features: Secondary | ICD-10-CM | POA: Insufficient documentation

## 2018-03-02 DIAGNOSIS — G4700 Insomnia, unspecified: Secondary | ICD-10-CM | POA: Diagnosis present

## 2018-03-02 DIAGNOSIS — R51 Headache: Secondary | ICD-10-CM | POA: Insufficient documentation

## 2018-03-02 DIAGNOSIS — F333 Major depressive disorder, recurrent, severe with psychotic symptoms: Secondary | ICD-10-CM | POA: Diagnosis present

## 2018-03-02 DIAGNOSIS — R45851 Suicidal ideations: Secondary | ICD-10-CM | POA: Diagnosis present

## 2018-03-02 DIAGNOSIS — F10920 Alcohol use, unspecified with intoxication, uncomplicated: Secondary | ICD-10-CM | POA: Insufficient documentation

## 2018-03-02 DIAGNOSIS — F10129 Alcohol abuse with intoxication, unspecified: Secondary | ICD-10-CM | POA: Diagnosis present

## 2018-03-02 LAB — CBC
HCT: 53 % — ABNORMAL HIGH (ref 39.0–52.0)
Hemoglobin: 17.3 g/dL — ABNORMAL HIGH (ref 13.0–17.0)
MCH: 29.9 pg (ref 26.0–34.0)
MCHC: 32.6 g/dL (ref 30.0–36.0)
MCV: 91.5 fL (ref 80.0–100.0)
PLATELETS: 200 10*3/uL (ref 150–400)
RBC: 5.79 MIL/uL (ref 4.22–5.81)
RDW: 12.6 % (ref 11.5–15.5)
WBC: 7.1 10*3/uL (ref 4.0–10.5)
nRBC: 0 % (ref 0.0–0.2)

## 2018-03-02 LAB — SALICYLATE LEVEL: Salicylate Lvl: <7 mg/dL (ref 2.8–30.0)

## 2018-03-02 LAB — COMPREHENSIVE METABOLIC PANEL
ALT: 16 U/L (ref 0–44)
ANION GAP: 9 (ref 5–15)
AST: 21 U/L (ref 15–41)
Albumin: 4.6 g/dL (ref 3.5–5.0)
Alkaline Phosphatase: 54 U/L (ref 38–126)
BUN: 5 mg/dL — ABNORMAL LOW (ref 6–20)
CO2: 26 mmol/L (ref 22–32)
Calcium: 9 mg/dL (ref 8.9–10.3)
Chloride: 110 mmol/L (ref 98–111)
Creatinine, Ser: 0.67 mg/dL (ref 0.61–1.24)
GFR calc Af Amer: >60 mL/min (ref >60)
GFR calc non Af Amer: >60 mL/min (ref >60)
Glucose, Bld: 103 mg/dL — ABNORMAL HIGH (ref 70–99)
Potassium: 3.6 mmol/L (ref 3.5–5.1)
Sodium: 145 mmol/L (ref 135–145)
Total Bilirubin: 0.3 mg/dL (ref 0.3–1.2)
Total Protein: 7.5 g/dL (ref 6.5–8.1)

## 2018-03-02 LAB — RAPID URINE DRUG SCREEN, HOSP PERFORMED
Amphetamines: NOT DETECTED
Barbiturates: NOT DETECTED
Benzodiazepines: NOT DETECTED
Cocaine: NOT DETECTED
OPIATES: NOT DETECTED
Tetrahydrocannabinol: POSITIVE — AB

## 2018-03-02 LAB — ACETAMINOPHEN LEVEL: Acetaminophen (Tylenol), Serum: <10 ug/mL — ABNORMAL LOW (ref 10–30)

## 2018-03-02 LAB — ETHANOL: ALCOHOL ETHYL (B): 182 mg/dL — AB (ref <10)

## 2018-03-02 MED ORDER — ASPIRIN EC 81 MG PO TBEC
81.0000 mg | DELAYED_RELEASE_TABLET | Freq: Every day | ORAL | Status: DC
  Administered 2018-03-02 – 2018-03-03 (×2): 81 mg via ORAL
  Filled 2018-03-02 (×5): qty 1

## 2018-03-02 MED ORDER — ALUM & MAG HYDROXIDE-SIMETH 200-200-20 MG/5ML PO SUSP: 30.0000 mL | ORAL | Status: DC | PRN

## 2018-03-02 MED ORDER — ESCITALOPRAM OXALATE 5 MG PO TABS
5.0000 mg | ORAL_TABLET | Freq: Every day | ORAL | Status: DC
  Administered 2018-03-02 – 2018-03-03 (×2): 5 mg via ORAL
  Filled 2018-03-02 (×3): qty 1
  Filled 2018-03-02 (×2): qty 7
  Filled 2018-03-02: qty 1

## 2018-03-02 MED ORDER — ASPIRIN 325 MG PO TABS
325.0000 mg | ORAL_TABLET | Freq: Every day | ORAL | Status: DC
  Filled 2018-03-02 (×2): qty 1

## 2018-03-02 MED ORDER — VITAMIN B-1 100 MG PO TABS
100.0000 mg | ORAL_TABLET | Freq: Every day | ORAL | Status: DC
  Administered 2018-03-03: 100 mg via ORAL
  Filled 2018-03-02 (×3): qty 1

## 2018-03-02 MED ORDER — ACETAMINOPHEN 325 MG PO TABS: 650.0000 mg | ORAL_TABLET | ORAL | Status: DC | PRN

## 2018-03-02 MED ORDER — TRAZODONE HCL 50 MG PO TABS
50.0000 mg | ORAL_TABLET | Freq: Every evening | ORAL | Status: DC | PRN
  Administered 2018-03-02: 50 mg via ORAL
  Filled 2018-03-02: qty 7
  Filled 2018-03-02: qty 1

## 2018-03-02 MED ORDER — THIAMINE HCL 100 MG/ML IJ SOLN: 100.0000 mg | Freq: Once | INTRAMUSCULAR | Status: DC

## 2018-03-02 MED ORDER — ADULT MULTIVITAMIN W/MINERALS CH
1.0000 | ORAL_TABLET | Freq: Every day | ORAL | Status: DC
  Administered 2018-03-03: 1 via ORAL
  Filled 2018-03-02 (×5): qty 1

## 2018-03-02 MED ORDER — MAGNESIUM HYDROXIDE 400 MG/5ML PO SUSP: 30.0000 mL | Freq: Every day | ORAL | Status: DC | PRN

## 2018-03-02 MED ORDER — HYDROXYZINE HCL 25 MG PO TABS: 25.0000 mg | ORAL_TABLET | Freq: Three times a day (TID) | ORAL | Status: DC | PRN

## 2018-03-02 MED ORDER — LOPERAMIDE HCL 2 MG PO CAPS: 2.0000 mg | ORAL_CAPSULE | ORAL | Status: DC | PRN

## 2018-03-02 MED ORDER — ONDANSETRON 4 MG PO TBDP
4.0000 mg | ORAL_TABLET | Freq: Once | ORAL | Status: AC
  Administered 2018-03-02: 4 mg via ORAL
  Filled 2018-03-02: qty 1

## 2018-03-02 MED ORDER — CHLORDIAZEPOXIDE HCL 25 MG PO CAPS: 25.0000 mg | ORAL_CAPSULE | Freq: Four times a day (QID) | ORAL | Status: DC | PRN

## 2018-03-02 MED ORDER — ONDANSETRON 4 MG PO TBDP: 4.0000 mg | ORAL_TABLET | Freq: Four times a day (QID) | ORAL | Status: DC | PRN

## 2018-03-02 MED ORDER — HYDROXYZINE HCL 25 MG PO TABS
25.0000 mg | ORAL_TABLET | Freq: Four times a day (QID) | ORAL | Status: DC | PRN
  Administered 2018-03-02: 25 mg via ORAL
  Filled 2018-03-02: qty 1

## 2018-03-02 MED ORDER — ACETAMINOPHEN 325 MG PO TABS: 650.0000 mg | ORAL_TABLET | Freq: Four times a day (QID) | ORAL | Status: DC | PRN

## 2018-03-02 NOTE — ED Notes (Signed)
BELONGINGS IN LOCKER 5
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
## 2018-03-02 NOTE — Plan of Care (Signed)
  Problem: Education: Goal: Emotional status will improve Outcome: Progressing Goal: Mental status will improve Outcome: Progressing Goal: Verbalization of understanding the information provided will improve Outcome: Progressing   Problem: Activity: Goal: Interest or engagement in activities will improve Outcome: Progressing   

## 2018-03-02 NOTE — ED Notes (Addendum)
Patient walked out of room asking where his belongings are. States "I feel fine and I just want to go home". Patient informed his belongings are safe with security and the doctor will speak with him when his tests have resulted.
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
## 2018-03-02 NOTE — ED Notes (Addendum)
Patient transported to X-ray 

## 2018-03-02 NOTE — Progress Notes (Signed)
Patient ID: Douglas Morrison, male   DOB: [birth year], 28 y.o.   MRN: [id number] D: Patient observed watching TV and interacting well with peers in the dayroom. Pt presented with depressed mood and flat affect. Pt reports his day was not going well but his family came to visit and that brighten his evening. Pt appears remorseful about what the circumstances that brought him here and reports he want to learn coping skills so that does not happen again. Pt attended evening AA group and Interacted appropriately with peers. Denies  SI/HI/AVH and pain.No behavioral issues noted.  A: Support and encouragement offered as needed to express needs. Medications administered as prescribed.  R: Patient is safe and cooperative on unit. Will continue to monitor  for safety and stability.

## 2018-03-02 NOTE — ED Notes (Signed)
Pt vomited in bathroom, states he began feeling sick to his stomach about 30 minutes ago; EDP notified
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
## 2018-03-02 NOTE — [facility] Suicide Risk Assessment (Signed)
[facility] INPATIENT:  Family/Significant Other Suicide Prevention Education  Suicide Prevention Education:  Education Completed; sister, Roderick Pee [phone] has been identified by the patient as the family member/significant other with whom the patient will be residing, and identified as the person(s) who will aid the patient in the event of a mental health crisis (suicidal ideations/suicide attempt).  With written consent from the patient, the family member/significant other has been provided the following suicide prevention education, prior to the and/or following the discharge of the patient.  The suicide prevention education provided includes the following:  Suicide risk factors  Suicide prevention and interventions  National Suicide Hotline telephone number  [facility] assessment telephone number  [facility] Emergency Assistance 911  [facility] and/or Residential Mobile Crisis Unit telephone number  Request made of family/significant other to:  Remove weapons (e.g., guns, rifles, knives), all items previously/currently identified as safety concern.    Remove drugs/medications (over-the-counter, prescriptions, illicit drugs), all items previously/currently identified as a safety concern.  The family member/significant other verbalizes understanding of the suicide prevention education information provided.  The family member/significant other agrees to remove the items of safety concern listed above.  Sister reports the patient (and family) stopped drinking after their father died from alcohol complications. Patient has not drink in several weeks according to sister. She has no safety concerns for the patient discharging and she offered for the patient to stay with her for a couple of days to keep a close eye on him.   Darreld Mclean 03/02/2018, 1:17 PM
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
## 2018-03-02 NOTE — BHH Group Notes (Signed)
LCSW Group Therapy Note  03/02/2018 4:03 PM  Type of Therapy and Topic: Group Therapy: Avoiding Self-Sabotaging and Enabling Behaviors  Participation Level: Did Not Attend  Description of Group:  In this group, patients will learn how to identify obstacles, self-sabotaging and enabling behaviors, as well as: what are they, why do we do them and what needs these behaviors meet. Discuss unhealthy relationships and how to have positive healthy boundaries with those that sabotage and enable. Explore aspects of self-sabotage and enabling in yourself and how to limit these self-destructive behaviors in everyday life.  Therapeutic Goals: 1. Patient will identify one obstacle that relates to self-sabotage and enabling behaviors 2. Patient will identify one personal self-sabotaging or enabling behavior they did prior to admission 3. Patient will state a plan to change the above identified behavior 4. Patient will demonstrate ability to communicate their needs through discussion and/or role play.   Summary of Patient Progress: n/a  Therapeutic Modalities:  Cognitive Behavioral Therapy Person-Centered Therapy Motivational Interviewing  Enid Cutter, MSW, [employer] Clinical Social Worker

## 2018-03-02 NOTE — BHH Suicide Risk Assessment (Signed)
B[facility] INPATIENT:  Family/Significant Other Suicide Prevention Education  Suicide Prevention Education:  Education Completed; sister, Gwendolyn Grant [phone] has been identified by the patient as the family member/significant other with whom the patient will be residing, and identified as the person(s) who will aid the patient in the event of a mental health crisis (suicidal ideations/suicide attempt).  With written consent from the patient, the family member/significant other has been provided the following suicide prevention education, prior to the and/or following the discharge of the patient.  The suicide prevention education provided includes the following:  Suicide risk factors  Suicide prevention and interventions  National Suicide Hotline telephone number  [facility] assessment telephone number  [facility] Emergency Assistance 911  [facility] and/or Residential Mobile Crisis Unit telephone number  Request made of family/significant other to:  Remove weapons (e.g., guns, rifles, knives), all items previously/currently identified as safety concern.    Remove drugs/medications (over-the-counter, prescriptions, illicit drugs), all items previously/currently identified as a safety concern.  The family member/significant other verbalizes understanding of the suicide prevention education information provided.  The family member/significant other agrees to remove the items of safety concern listed above.  Sister shared similar background information that patient's other sister shared. The family decided to stay away from alcohol after the patient's father died. She is not aware of the patient drinking in a few weeks. She shares the patient is under a lot of pressure and has been "the man of the house." She wants to have a family meeting to talk about dividing up responsibilities and asked CSW how she can show her brother love and support.   She does not think the  patient would attempt to harm himself or others.   Darreld Mclean 03/02/2018, 1:19 PM

## 2018-03-02 NOTE — Progress Notes (Signed)
Pt accepted to [facility], Bed 306-1 Reola Calkins, NP is the accepting provider.  Rudolpho Sevin, MD is the attending provider.  Call report to [phone]  [facility] ED notified.   Pt is Voluntary.  Pt may be transported by [facility]  Pt scheduled  to arrive at [facility] @as  soon as transport can be arranged.  Timmothy Euler. Kaylyn Lim, MSW, LCSWA Disposition Clinical Social Work [phone] (cell) [phone] (office)
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
## 2018-03-02 NOTE — ED Notes (Signed)
Meal tray at bedside.  

## 2018-03-02 NOTE — ED Notes (Signed)
TTS recommendation is for patient to be reevaluated by psychiatry in the morning. PA Mia made aware.

## 2018-03-02 NOTE — Tx Team (Signed)
Initial Treatment Plan 03/02/2018 2:51 PM Douglas Morrison MNO:[id number]    PATIENT STRESSORS: Financial difficulties Occupational concerns Traumatic event   PATIENT STRENGTHS: Ability for insight General fund of knowledge Motivation for treatment/growth   PATIENT IDENTIFIED PROBLEMS: "Depression"  "Anxiety"  "stress"                 DISCHARGE CRITERIA:  Ability to meet basic life and health needs Improved stabilization in mood, thinking, and/or behavior  PRELIMINARY DISCHARGE PLAN: Outpatient therapy  PATIENT/FAMILY INVOLVEMENT: This treatment plan has been presented to and reviewed with the patient, Douglas Morrison. The patient and family have been given the opportunity to ask questions and make suggestions.  Dewayne Shorter, RN 03/02/2018, 2:51 PM

## 2018-03-02 NOTE — ED Notes (Signed)
Patient ambulatory to nurses' desk requesting to go home. Patient states "I feel fine and I just really want to go home now, it's six in the morning and I just want to be in my own bed." Procedures explained to patient, will notify EDP that the patient wishes to leave now.
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
## 2018-03-02 NOTE — ED Notes (Signed)
Called [facility] for transport. 

## 2018-03-03 LAB — TSH: TSH: 1.038 u[IU]/mL (ref 0.350–4.500)

## 2018-03-03 MED ORDER — TRAZODONE HCL 50 MG PO TABS: 50.0000 mg | ORAL_TABLET | Freq: Every evening | ORAL | 0 refills | Status: AC | PRN

## 2018-03-03 MED ORDER — ESCITALOPRAM OXALATE 5 MG PO TABS: 5.0000 mg | ORAL_TABLET | Freq: Every day | ORAL | 0 refills | Status: AC

## 2018-03-03 NOTE — Progress Notes (Signed)
Pt completed daily assessment and on this he wrote he denied having SI today and he rated his depression, hopelessness and anxiety " 0/0/2", respectively. He completed his SSP, all dc instructions are reviewed with him by this Clinical research associate and he stated verbal understanding and willingness to comply. HE was given cc of dc instructionns ( SRA, AVS, SSP and transition record) and then given prescriptions for meds as well s  7 day supply of meds. All belongings in his locker were returned to him and he was escorted to the lobby, where he will wait for his transportaiton home. Pt scheduled to return to work this am. DC cmopleted per MD order.

## 2018-03-03 NOTE — [facility] Counselor (Signed)
Clinical Social Work Note  Psychosocial Assessment not done, as patient is leaving in less than 72 hours of admission.  Ambrose Mantle, LCSW 03/03/2018, 8:21 AM

## 2018-03-03 NOTE — Progress Notes (Signed)
Adult Psychoeducational Group Note  Date:  03/03/2018 Time:  5:11 PM  Group Topic/Focus:  Goals Group:   The focus of this group is to help patients establish daily goals to achieve during treatment and discuss how the patient can incorporate goal setting into their daily lives to aide in recovery.  Participation Level:  Active  Participation Quality:  Appropriate  Affect:  Appropriate  Cognitive:  Appropriate  Insight: Appropriate  Engagement in Group:  Engaged  Modes of Intervention:  Discussion  Additional Comments:  Pt attended group and participated in discussion.  Jeriko Kowalke R Junice Fei 03/03/2018, 5:11 PM
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
## 2018-03-03 NOTE — BHH Suicide Risk Assessment (Signed)
[facility] Discharge Suicide Risk Assessment   Principal Problem: Acute stress reaction causing mixed disturbance of emotion and conduct Discharge Diagnoses: Principal Problem:   Acute stress reaction causing mixed disturbance of emotion and conduct Active Problems:   Alcohol intoxication with mild use disorder (HCC)   Total Time spent with patient: 15 minutes  Musculoskeletal: Strength & Muscle Tone: within normal limits Gait & Station: normal Patient leans: N/A  Psychiatric Specialty Exam: Review of Systems  All other systems reviewed and are negative.   Blood pressure 124/83, pulse 87, temperature (!) 97.5 F (36.4 C), temperature source Oral, resp. rate 20, height 5\' 5"  (1.651 m), weight 63.5 kg.Body mass index is 23.3 kg/m.  General Appearance: Casual  Eye Contact::  Good  Speech:  Normal Rate409  Volume:  Normal  Mood:  Euthymic  Affect:  Congruent  Thought Process:  Coherent and Descriptions of Associations: Intact  Orientation:  Full (Time, Place, and Person)  Thought Content:  Logical  Suicidal Thoughts:  No  Homicidal Thoughts:  No  Memory:  Immediate;   Fair Recent;   Fair Remote;   Fair  Judgement:  Intact  Insight:  Fair  Psychomotor Activity:  Normal  Concentration:  Good  Recall:  Good  Fund of Knowledge:Good  Language: Good  Akathisia:  Negative  Handed:  Right  AIMS (if indicated):     Assets:  Communication Skills Desire for Improvement Housing Physical Health Resilience Social Support  Sleep:  Number of Hours: 6  Cognition: WNL  ADL's:  Intact   Mental Status Per Nursing Assessment::   On Admission:  NA  Demographic Factors:  Male and Low socioeconomic status  Loss Factors: NA  Historical Factors: Impulsivity  Risk Reduction Factors:   Sense of responsibility to family, Employed, Living with another person, especially a relative, Positive social support and Positive coping skills or problem solving skills  Continued Clinical  Symptoms:  Severe Anxiety and/or Agitation Depression:   Impulsivity  Cognitive Features That Contribute To Risk:  None    Suicide Risk:  Minimal: No identifiable suicidal ideation.  Patients presenting with no risk factors but with morbid ruminations; may be classified as minimal risk based on the severity of the depressive symptoms  Follow-up Information    [facility]. Go on 04/17/2018.   Why:  Please attend your new patient appointment on 04/17/2018 at 1:50pm. Bring photo ID with you and hospital discharge paperwork. Contact information: [address] [place] [address] [phone]       [facility]. Go on 03/05/2018.   Why:  Please attend your hospital discharge appointment on 03/05/2018 at 8:30am. Bring photo ID and your hospital discharge paperwork. Contact information: [address] [place] [address] [phone]           Plan Of Care/Follow-up recommendations:  Activity:  ad lib  Antonieta Pert, MD 03/03/2018, 7:57 AM

## 2018-03-03 NOTE — Progress Notes (Signed)
  [facility] Adult Case Management Discharge Plan :  Will you be returning to the same living situation after discharge:  Yes,  with family (mother and sister) At discharge, do you have transportation home?: Yes,  family Do you have the ability to pay for your medications: No.  Was referred to community agency for assistance   Release of information consent forms completed and turned in to Medical Records by CSW.   Patient to Follow up at: Follow-up Information    [facility]. Go on 04/17/2018.   Why:  Please attend your new patient appointment on 04/17/2018 at 1:50pm. Bring photo ID with you and hospital discharge paperwork. Contact information: [address] [place] [address] [phone]       [facility]. Go on 03/05/2018.   Why:  Please attend your hospital discharge appointment on 03/05/2018 at 8:30am. Bring photo ID and your hospital discharge paperwork. Contact information: [address] [place] [address] [phone]           Next level of care provider has access to [facility] Link:no  Safety Planning and Suicide Prevention discussed: Yes,  with sister     Has patient been referred to the Quitline?: N/A patient is not a smoker  Patient has been referred for addiction treatment: Yes  Lynnell Chad, LCSW 03/03/2018, 8:22 AM

## 2018-04-17 ENCOUNTER — Ambulatory Visit: Payer: Self-pay | Admitting: Nurse Practitioner

## 2018-05-16 ENCOUNTER — Other Ambulatory Visit: Payer: Self-pay

## 2018-05-16 ENCOUNTER — Ambulatory Visit: Payer: Self-pay | Attending: Nurse Practitioner | Admitting: Nurse Practitioner

## 2018-05-16 NOTE — Progress Notes (Incomplete)
Virtual Visit via Telephone Note Due to national recommendations of social distancing due to COVID 19, telehealth visit is felt to be most appropriate for this patient at this time.  I discussed the limitations, risks, security and privacy concerns of performing an evaluation and management service by telephone and the availability of in person appointments. I also discussed with the patient that there may be a patient responsible charge related to this service. The patient expressed understanding and agreed to proceed.    I connected with Douglas Morrison on 05/16/18  at   3:30 PM EDT  EDT by telephone and verified that I am speaking with the correct person using two identifiers.   Consent I discussed the limitations, risks, security and privacy concerns of performing an evaluation and management service by telephone and the availability of in person appointments. I also discussed with the patient that there may be a patient responsible charge related to this service. The patient expressed understanding and agreed to proceed.   Location of Patient: Private  ***   Location of Provider: [facility]    Persons participating in Telemedicine visit: Bertram Denver FNP-BC YY Bien CMA Laiken Morrison    History of Present Illness: Telemedicine visit for: Establish care  He has a history of hemochromatosis, anxiety and depression with suicidal intent for which he was hospitalized to [facility] 02-2018.    Past Medical History:  Diagnosis Date  . Depression     Past Surgical History:  Procedure Laterality Date  . EYE MUSCLE SURGERY Left ~ 1997   "lazy eye"  . FEMUR IM NAIL Left 06/26/2013   Procedure: INTRAMEDULLARY (IM) NAIL FEMORAL,;  Surgeon: Javier Docker, MD;  Location: [facility] OR;  Service: Orthopedics;  Laterality: Left;  . I&D EXTREMITY Left 06/26/2013   Procedure: IRRIGATION AND DEBRIDEMENT LEFT LEG;  Surgeon: Javier Docker, MD;  Location: [facility] OR;  Service: Orthopedics;  Laterality: Left;    No family history on file.  Social History   Socioeconomic History  . Marital status: Single    Spouse name: Not on file  . Number of children: Not on file  . Years of education: Not on file  . Highest education level: Not on file  Occupational History  . Not on file  Social Needs  . Financial resource strain: Not on file  . Food insecurity:    Worry: Not on file    Inability: Not on file  . Transportation needs:    Medical: Not on file    Non-medical: Not on file  Tobacco Use  . Smoking status: Never Smoker  . Smokeless tobacco: Never Used  Substance and Sexual Activity  . Alcohol use: Yes    Alcohol/week: 2.0 standard drinks    Types: 2 Glasses of wine per week  . Drug use: Yes    Types: Marijuana  . Sexual activity: Not Currently  Lifestyle  . Physical activity:    Days per week: Not on file    Minutes per session: Not on file  . Stress: Not on file  Relationships  . Social connections:    Talks on phone: Not on file    Gets together: Not on file    Attends religious service: Not on file    Active member of club or organization: Not on file    Attends meetings of clubs or organizations: Not on file    Relationship status: Not on file  Other Topics Concern  .  Not on file  Social History Narrative  . Not on file     Observations/Objective: Awake, alert and oriented x 3   ROS  Assessment and Plan: There are no diagnoses linked to this encounter.   Follow Up Instructions No follow-ups on file.     I discussed the assessment and treatment plan with the patient. The patient was provided an opportunity to ask questions and all were answered. The patient agreed with the plan and demonstrated an understanding of the instructions.   The patient was advised to call back or seek an in-person evaluation if the symptoms worsen or if the condition fails to improve as anticipated.  I  provided *** minutes of non-face-to-face time during this encounter including median intraservice time, reviewing previous notes, labs, imaging, medications and explaining diagnosis and management.  Claiborne RiggZelda W Kayla Deshaies, FNP-BC

## 2019-12-08 ENCOUNTER — Other Ambulatory Visit: Payer: Self-pay

## 2019-12-08 ENCOUNTER — Emergency Department (HOSPITAL_COMMUNITY)
Admission: EM | Admit: 2019-12-08 | Discharge: 2019-12-08 | Disposition: A | Discharge status: Home / Self Care | Payer: 59 | Attending: Emergency Medicine | Admitting: Emergency Medicine

## 2019-12-08 ENCOUNTER — Emergency Department (HOSPITAL_COMMUNITY): Payer: 59

## 2019-12-08 DIAGNOSIS — S92421A Displaced fracture of distal phalanx of right great toe, initial encounter for closed fracture: Secondary | ICD-10-CM | POA: Diagnosis not present

## 2019-12-08 DIAGNOSIS — X500XXA Overexertion from strenuous movement or load, initial encounter: Secondary | ICD-10-CM | POA: Insufficient documentation

## 2019-12-08 DIAGNOSIS — S90931A Unspecified superficial injury of right great toe, initial encounter: Secondary | ICD-10-CM | POA: Diagnosis present

## 2019-12-08 MED ORDER — ACETAMINOPHEN 500 MG PO TABS: 500.0000 mg | ORAL_TABLET | Freq: Four times a day (QID) | ORAL | 0 refills | Status: AC | PRN

## 2019-12-08 MED ORDER — KETOROLAC TROMETHAMINE 60 MG/2ML IM SOLN
60.0000 mg | Freq: Once | INTRAMUSCULAR | Status: AC
  Administered 2019-12-08: 60 mg via INTRAMUSCULAR
  Filled 2019-12-08: qty 2

## 2019-12-08 MED ORDER — IBUPROFEN 600 MG PO TABS: 600.0000 mg | ORAL_TABLET | Freq: Four times a day (QID) | ORAL | 0 refills | Status: AC | PRN

## 2019-12-08 NOTE — Progress Notes (Signed)
Orthopedic Tech Progress Note Patient Details:  Douglas Morrison Feb 05, 1990 [id number]  Ortho Devices Type of Ortho Device: CAM walker, Crutches Ortho Device/Splint Location: Right Lower Extremity Ortho Device/Splint Interventions: Ordered, Application, Adjustment   Post Interventions Patient Tolerated: Fair Instructions Provided: Adjustment of device, Care of device, Poper ambulation with device   [name] 12/08/2019, 9:39 AM

## 2019-12-08 NOTE — ED Triage Notes (Signed)
Pt. Stated I dropped a weight you lift o my rt. Foot last ight I was unable to sleep all night.

## 2019-12-08 NOTE — ED Notes (Signed)
Ortho tech at bedside to place in ortho boot per MD order

## 2019-12-08 NOTE — ED Notes (Signed)
Ortho notified and will be down shortly to place boot.
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
## 2019-12-08 NOTE — Discharge Instructions (Addendum)
Please follow-up with your primary care provider regarding today's encounter.  I would also like you to follow-up with Dr. Charlann Boxer, orthopedics, for ongoing evaluation and management.  Please continue to wear your cam walker and use crutches as needed.  Avoid weightbearing if possible until you are evaluated by orthopedics.  Please take the ibuprofen and Tylenol as needed for pain symptoms. Continue to rest, ice, and elevate the affected foot.    Return to the ED or seek immediate medical attention for any new or worsening symptoms.

## 2021-04-17 IMAGING — CR DG FOOT COMPLETE 3+V*R*
3 series · 3 of 3 positions shown · non-contrast
Comparison: None.

CLINICAL DATA: Injury.

EXAM:
RIGHT FOOT COMPLETE - 3+ VIEW

[foot ap]
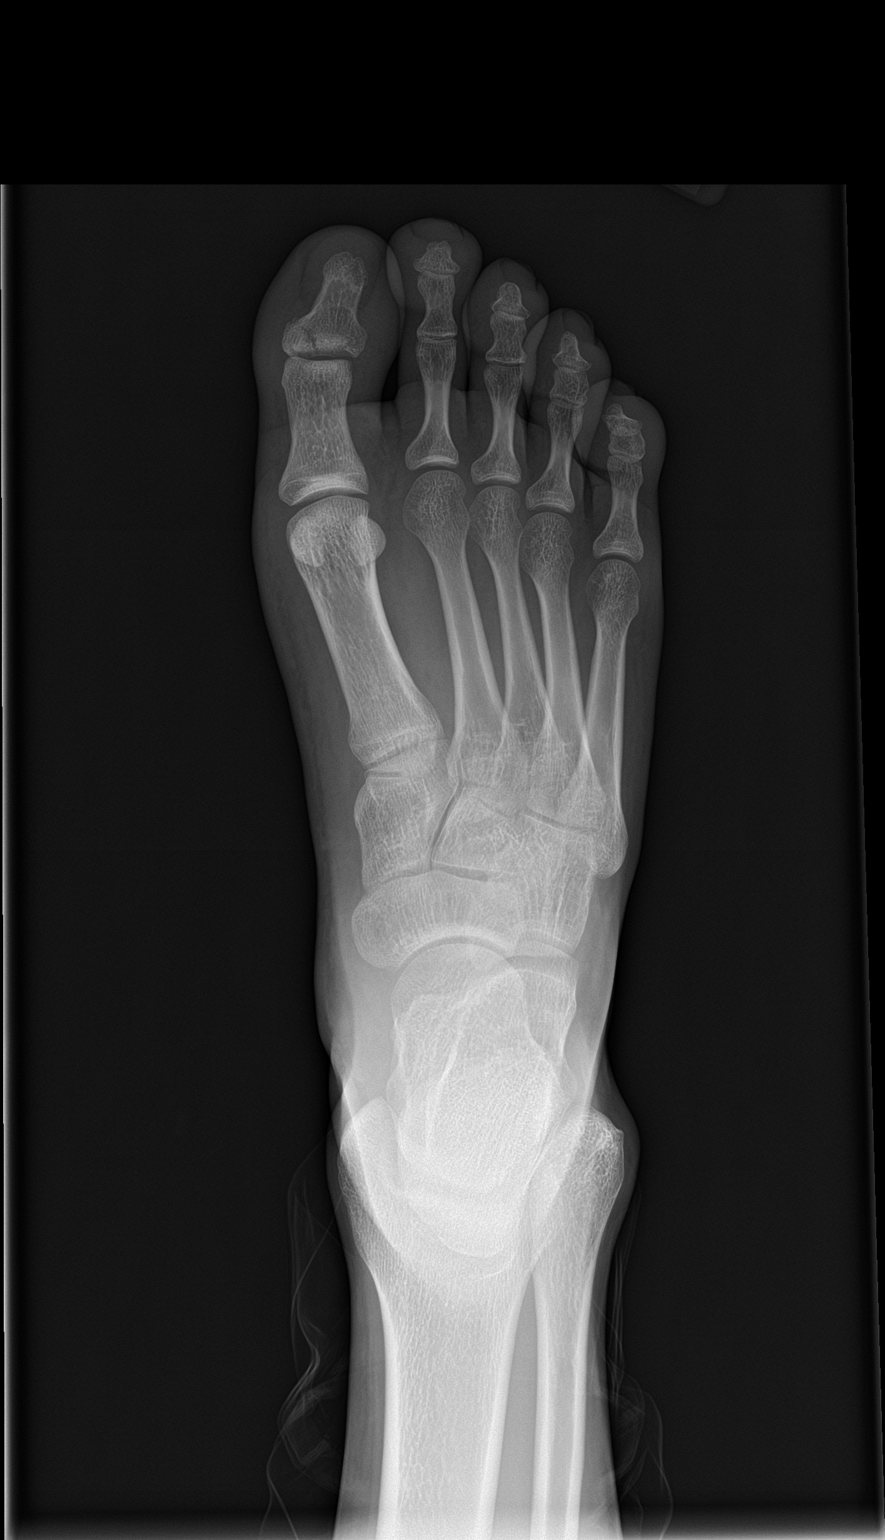

[foot obl]
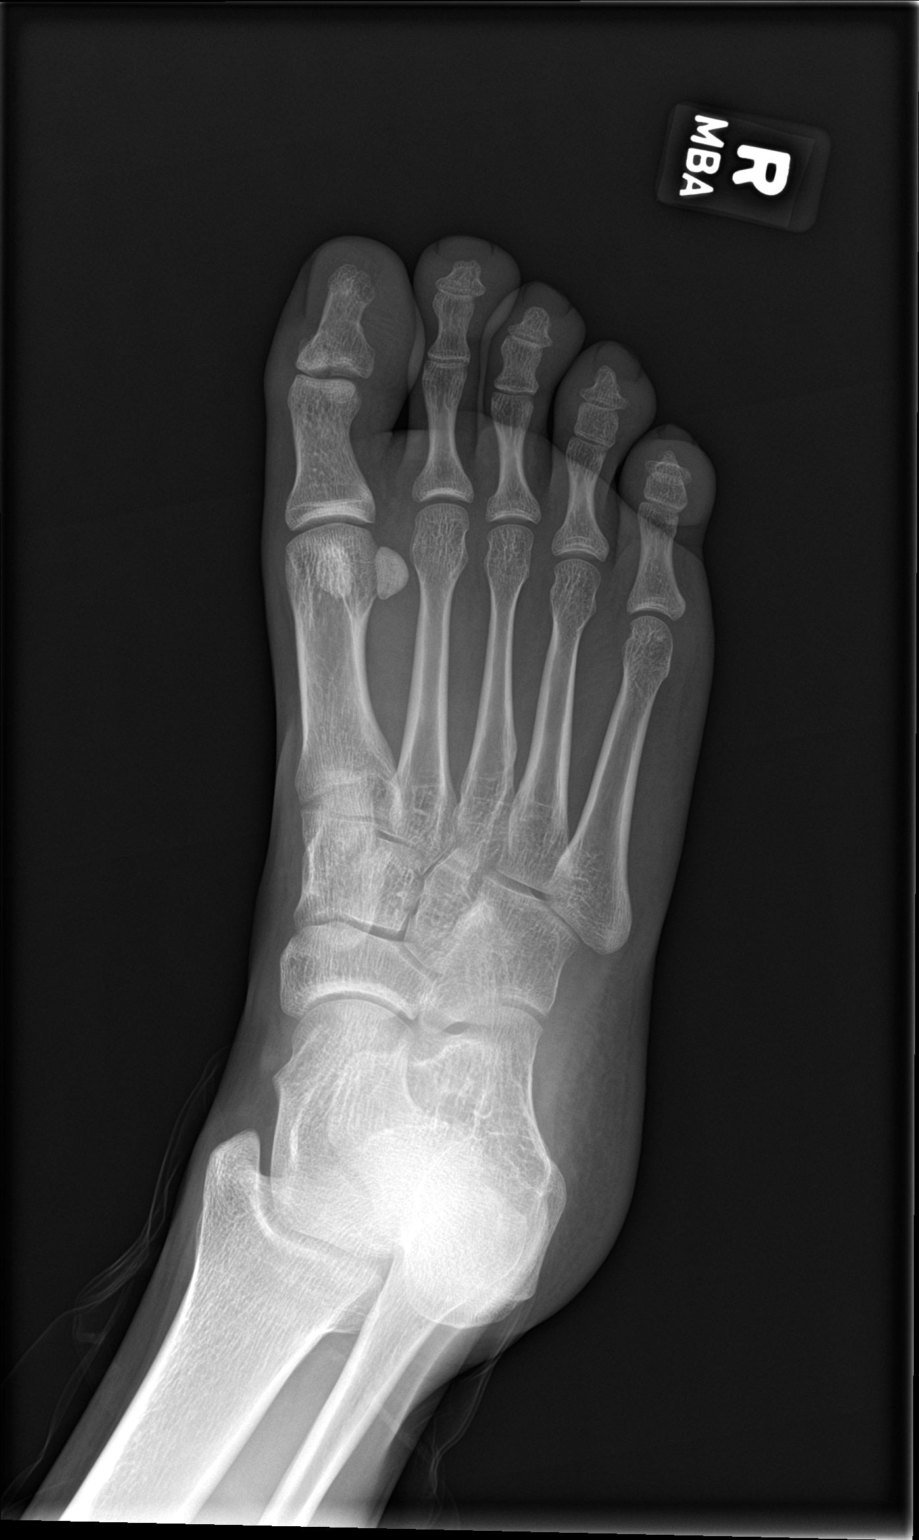

[foot lat]
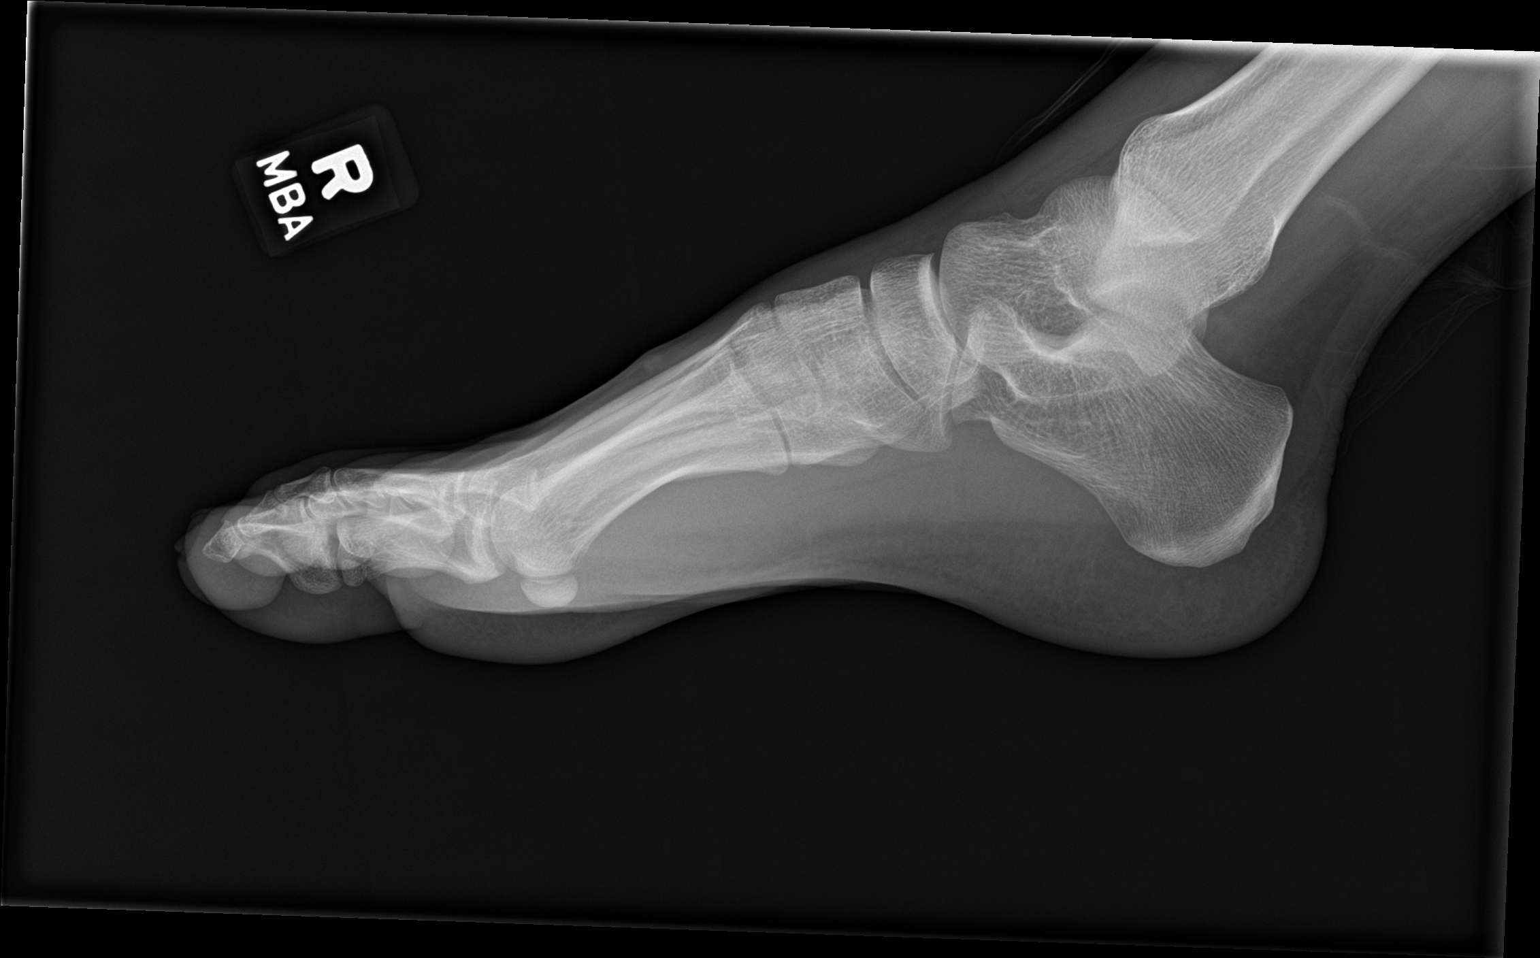

[3 of 3 positions shown; findings below may reference images not displayed]

FINDINGS: Minimally displaced fracture at the base of the distal phalanx of
the great toe. Fracture line extends to the proximal articular
surface. Alignment at the underlying IP joint remains normal.

Remainder of the osseous structures of the RIGHT foot appear intact
and normally aligned. Soft tissues are unremarkable.
IMPRESSION: Minimally displaced fracture at the base of the distal phalanx of
the great toe. Fracture line extends to the proximal articular
surface.
